# Patient Record
Sex: Male | Born: 1970
Health system: Southern US, Community
[De-identification: ages and names within clinical notes are randomized; demographics above are authoritative.]

## PROBLEM LIST (undated history)

## (undated) DIAGNOSIS — F32A Depression, unspecified: Secondary | ICD-10-CM

## (undated) DIAGNOSIS — F419 Anxiety disorder, unspecified: Secondary | ICD-10-CM

## (undated) DIAGNOSIS — E785 Hyperlipidemia, unspecified: Secondary | ICD-10-CM

## (undated) DIAGNOSIS — I10 Essential (primary) hypertension: Secondary | ICD-10-CM

## (undated) DIAGNOSIS — F329 Major depressive disorder, single episode, unspecified: Secondary | ICD-10-CM

## (undated) HISTORY — DX: Essential (primary) hypertension: I10

## (undated) HISTORY — DX: Hyperlipidemia, unspecified: E78.5

## (undated) HISTORY — DX: Major depressive disorder, single episode, unspecified: F32.9

## (undated) HISTORY — PX: HERNIA REPAIR: SHX51

## (undated) HISTORY — DX: Anxiety disorder, unspecified: F41.9

## (undated) HISTORY — DX: Depression, unspecified: F32.A

## (undated) HISTORY — PX: OTHER SURGICAL HISTORY: SHX169

---

## 2010-05-10 ENCOUNTER — Emergency Department (HOSPITAL_COMMUNITY): Admission: EM | Admit: 2010-05-10 | Discharge: 2010-05-10 | Payer: Self-pay | Admitting: Family Medicine

## 2010-05-19 ENCOUNTER — Emergency Department (HOSPITAL_COMMUNITY)
Admission: EM | Admit: 2010-05-19 | Discharge: 2010-05-19 | Payer: Self-pay | Source: Home / Self Care | Admitting: Emergency Medicine

## 2010-12-07 ENCOUNTER — Inpatient Hospital Stay (INDEPENDENT_AMBULATORY_CARE_PROVIDER_SITE_OTHER)
Admission: RE | Admit: 2010-12-07 | Discharge: 2010-12-07 | Disposition: A | Discharge status: Home / Self Care | Payer: 59 | Source: Ambulatory Visit | Attending: Family Medicine | Admitting: Family Medicine

## 2010-12-07 DIAGNOSIS — J029 Acute pharyngitis, unspecified: Secondary | ICD-10-CM

## 2011-04-16 ENCOUNTER — Inpatient Hospital Stay (INDEPENDENT_AMBULATORY_CARE_PROVIDER_SITE_OTHER)
Admission: RE | Admit: 2011-04-16 | Discharge: 2011-04-16 | Disposition: A | Discharge status: Home / Self Care | Payer: 59 | Source: Ambulatory Visit | Attending: Family Medicine | Admitting: Family Medicine

## 2011-04-16 DIAGNOSIS — Z0489 Encounter for examination and observation for other specified reasons: Secondary | ICD-10-CM

## 2011-05-10 ENCOUNTER — Emergency Department (HOSPITAL_COMMUNITY)
Admission: EM | Admit: 2011-05-10 | Discharge: 2011-05-10 | Disposition: A | Discharge status: Home / Self Care | Payer: 59 | Attending: Emergency Medicine | Admitting: Emergency Medicine

## 2011-05-10 DIAGNOSIS — R61 Generalized hyperhidrosis: Secondary | ICD-10-CM | POA: Insufficient documentation

## 2011-05-10 DIAGNOSIS — R110 Nausea: Secondary | ICD-10-CM | POA: Insufficient documentation

## 2011-05-10 DIAGNOSIS — Z79899 Other long term (current) drug therapy: Secondary | ICD-10-CM | POA: Insufficient documentation

## 2011-05-10 DIAGNOSIS — I10 Essential (primary) hypertension: Secondary | ICD-10-CM | POA: Insufficient documentation

## 2011-05-10 DIAGNOSIS — F341 Dysthymic disorder: Secondary | ICD-10-CM | POA: Insufficient documentation

## 2011-05-10 DIAGNOSIS — E7800 Pure hypercholesterolemia, unspecified: Secondary | ICD-10-CM | POA: Insufficient documentation

## 2011-05-10 LAB — POCT I-STAT, CHEM 8
BUN: 24 mg/dL — ABNORMAL HIGH (ref 6–23)
Chloride: 104 mEq/L (ref 96–112)
Creatinine, Ser: 1.1 mg/dL (ref 0.50–1.35)
Glucose, Bld: 109 mg/dL — ABNORMAL HIGH (ref 70–99)
Potassium: 3.5 mEq/L (ref 3.5–5.1)

## 2011-09-21 ENCOUNTER — Ambulatory Visit (INDEPENDENT_AMBULATORY_CARE_PROVIDER_SITE_OTHER): Payer: 59

## 2011-09-21 DIAGNOSIS — R609 Edema, unspecified: Secondary | ICD-10-CM

## 2011-09-21 DIAGNOSIS — J9801 Acute bronchospasm: Secondary | ICD-10-CM

## 2011-09-21 DIAGNOSIS — I10 Essential (primary) hypertension: Secondary | ICD-10-CM

## 2011-10-11 ENCOUNTER — Ambulatory Visit (INDEPENDENT_AMBULATORY_CARE_PROVIDER_SITE_OTHER): Payer: 59 | Admitting: Family Medicine

## 2011-10-11 VITALS — BP 119/75 | HR 73 | Temp 98.1°F | Resp 16 | Ht 68.0 in | Wt 195.0 lb

## 2011-10-11 DIAGNOSIS — Z716 Tobacco abuse counseling: Secondary | ICD-10-CM

## 2011-10-11 DIAGNOSIS — Z7189 Other specified counseling: Secondary | ICD-10-CM

## 2011-10-11 DIAGNOSIS — R110 Nausea: Secondary | ICD-10-CM

## 2011-10-11 MED ORDER — PROMETHAZINE HCL 25 MG PO TABS: 25.0000 mg | ORAL_TABLET | Freq: Three times a day (TID) | ORAL | Status: DC | PRN

## 2011-10-11 NOTE — Progress Notes (Signed)
  [facility]:  Office Visit  Chief Complaint:  Chief Complaint  Patient presents with  . Medication Refill    for stopping smoking- also has an issue with FMLA paperwork dates being incorrect     HPI: Seth Waters is a 41 y.o. male who complains of desire to stop smoking, starting Zyban next week on 10/16/11. He gets nauseated when he tries to quit.  No other sxs.  Past Medical History  Diagnosis Date  . Hyperlipidemia   . Hypertension    History reviewed. No pertinent past surgical history. History   Social History  . Marital Status: Single    Spouse Name: N/A    Number of Children: N/A  . Years of Education: N/A   Social History Main Topics  . Smoking status: Current Everyday Smoker -- 15 years    Types: Cigarettes  . Smokeless tobacco: Former Neurosurgeon    Quit date: 10/11/2011  . Alcohol Use: No  . Drug Use: No  . Sexually Active: None   Other Topics Concern  . None   Social History Narrative  . None   History reviewed. No pertinent family history. Allergies  Allergen Reactions  . Simvastatin Swelling   Prior to Admission medications   Medication Sig Start Date End Date Taking? Authorizing Provider  olmesartan (BENICAR) 20 MG tablet Take 20 mg by mouth daily.   Yes Historical Provider, MD  PARoxetine (PAXIL-CR) 25 MG 24 hr tablet Take 25 mg by mouth every morning.   Yes Historical Provider, MD     ROS: The patient denies fevers, chills, night sweats, unintentional weight loss, chest pain, palpitations, wheezing, dyspnea on exertion, nausea, vomiting, abdominal pain, dysuria, hematuria, melena, numbness, weakness, or tingling.  All other systems have been reviewed and were otherwise negative with the exception of those mentioned in the HPI and as above.    PHYSICAL EXAM: Filed Vitals:   10/11/11 1339  BP: 119/75  Pulse: 73  Temp: 98.1 F (36.7 C)  Resp: 16   Filed Vitals:   10/11/11 1339  Height: 5\' 8"  (1.727 m)  Weight: 195 lb  (88.451 kg)   Body mass index is 29.65 kg/(m^2).  General: Alert, no acute distress HEENT:  Normocephalic, atraumatic, oropharynx patent. Cardiovascular:  Regular rate and rhythm, no rubs murmurs or gallops.  No Carotid bruits, radial pulse intact. No pedal edema.  Respiratory: Clear to auscultation bilaterally.  No wheezes, rales, or rhonchi.  No cyanosis, no use of accessory musculature GI: No organomegaly, abdomen is soft and non-tender, positive bowel sounds.  No masses. Skin: No rashes. Neurologic: Facial musculature symmetric. Psychiatric: Patient is appropriate throughout our interaction. Lymphatic: No cervical lymphadenopathy Musculoskeletal: Gait intact.  ASSESSMENT/PLAN: Encounter Diagnoses  Name Primary?  . Encounter for smoking cessation counseling Yes  . Nausea    Patient was given rx for Phenergen. This is multiple attempt at quiting > 10 times. He is starting next week on 10/16/11.     Aydenn Gervin PHUONG, DO 10/11/2011 2:19 PM

## 2011-10-14 DIAGNOSIS — Z0271 Encounter for disability determination: Secondary | ICD-10-CM

## 2011-11-24 ENCOUNTER — Ambulatory Visit (INDEPENDENT_AMBULATORY_CARE_PROVIDER_SITE_OTHER): Payer: 59 | Admitting: Family Medicine

## 2011-11-24 VITALS — BP 154/110 | HR 72 | Temp 98.5°F | Resp 16 | Ht 68.5 in | Wt 198.4 lb

## 2011-11-24 DIAGNOSIS — F419 Anxiety disorder, unspecified: Secondary | ICD-10-CM

## 2011-11-24 DIAGNOSIS — E782 Mixed hyperlipidemia: Secondary | ICD-10-CM

## 2011-11-24 DIAGNOSIS — I10 Essential (primary) hypertension: Secondary | ICD-10-CM | POA: Insufficient documentation

## 2011-11-24 DIAGNOSIS — E785 Hyperlipidemia, unspecified: Secondary | ICD-10-CM | POA: Insufficient documentation

## 2011-11-24 DIAGNOSIS — F32A Depression, unspecified: Secondary | ICD-10-CM | POA: Insufficient documentation

## 2011-11-24 DIAGNOSIS — F411 Generalized anxiety disorder: Secondary | ICD-10-CM

## 2011-11-24 DIAGNOSIS — F329 Major depressive disorder, single episode, unspecified: Secondary | ICD-10-CM | POA: Insufficient documentation

## 2011-11-24 MED ORDER — OLMESARTAN MEDOXOMIL 20 MG PO TABS: 20.0000 mg | ORAL_TABLET | Freq: Every day | ORAL | Status: DC

## 2011-11-24 MED ORDER — ROSUVASTATIN CALCIUM 10 MG PO TABS: 10.0000 mg | ORAL_TABLET | Freq: Every day | ORAL | Status: DC

## 2011-11-24 MED ORDER — PAROXETINE HCL 20 MG PO TABS: 20.0000 mg | ORAL_TABLET | ORAL | Status: DC

## 2011-11-24 MED ORDER — CLONAZEPAM 0.5 MG PO TABS
0.5000 mg | ORAL_TABLET | Freq: Two times a day (BID) | ORAL | Status: DC | PRN
Start: 2011-11-24 — End: 2013-06-24

## 2011-11-24 NOTE — Progress Notes (Signed)
41 yo with extreme stress. Unmarried.  The last 6 months have been very stressful, with work being unpredictable.  The sustained release Paxil is not as effective as the rapid release.  Also, needs refill on Benicar.  Ran out of Benicar.    Has stopped the Crestor but would like to restart and see if myalgias that he has had come back.  O:  Alert, cooperative HEENT:  Unremarkable Chest:  Clear (congested cough) Heart:  Reg, no murmur Abdomen:  Soft, no HSM  A:  1. Hypertension, uncontrolled; 2. Stress, uncontrolled,;  3. Hyperlipidemia, status presumed uncontrolled  P:  Refill benicar Change paxil to the more rapid release Restart the crestor Follow-up 6 weeks.

## 2011-11-24 NOTE — Patient Instructions (Signed)

## 2012-02-17 ENCOUNTER — Emergency Department
Admission: EM | Admit: 2012-02-17 | Discharge: 2012-02-17 | Disposition: A | Discharge status: Home / Self Care | Payer: 59 | Source: Home / Self Care | Attending: Emergency Medicine | Admitting: Emergency Medicine

## 2012-02-17 ENCOUNTER — Encounter: Payer: Self-pay | Admitting: Emergency Medicine

## 2012-02-17 DIAGNOSIS — J069 Acute upper respiratory infection, unspecified: Secondary | ICD-10-CM

## 2012-02-17 MED ORDER — PREDNISONE (PAK) 10 MG PO TABS: 10.0000 mg | ORAL_TABLET | Freq: Every day | ORAL | Status: AC

## 2012-02-17 MED ORDER — AZITHROMYCIN 250 MG PO TABS: ORAL_TABLET | ORAL | Status: AC

## 2012-02-17 NOTE — ED Notes (Signed)
States exposed to URIs at work; x 8 days intermittent congestion and low grade fever; some coughing and generalized body aches.

## 2012-02-17 NOTE — ED Provider Notes (Signed)
History     CSN: [id number]  Arrival date & time 02/17/12  1658   First MD Initiated Contact with Patient 02/17/12 1701      Chief Complaint  Patient presents with  . Nasal Congestion  . Generalized Body Aches    (Consider location/radiation/quality/duration/timing/severity/associated sxs/prior treatment) HPI Seth Waters is a 41 y.o. male who complains of onset of cold symptoms for 8 days.  The symptoms are constant and mild in severity.  He has tried some OTC cough medicine which is helping. No sore throat + cough No pleuritic pain +/- wheezing + nasal congestion + post-nasal drainage No sinus pain/pressure + chest congestion No itchy/red eyes No earache No hemoptysis No SOB No chills/sweats No fever No nausea No vomiting No abdominal pain No diarrhea No skin rashes + fatigue No myalgias No headache    Past Medical History  Diagnosis Date  . Hyperlipidemia   . Hypertension   . Hyperlipidemia     Past Surgical History  Procedure Date  . Hernia repair     Family History  Problem Relation Age of Onset  . Hyperlipidemia Mother   . Hypertension Mother     History  Substance Use Topics  . Smoking status: Current Everyday Smoker -- 0.2 packs/day for 15 years    Types: Cigarettes  . Smokeless tobacco: Former Neurosurgeon    Quit date: 10/11/2011  . Alcohol Use: No      Review of Systems  All other systems reviewed and are negative.    Allergies  Simvastatin  Home Medications   Current Outpatient Rx  Name Route Sig Dispense Refill  . AZITHROMYCIN 250 MG PO TABS  Use as directed 1 each 0  . CLONAZEPAM 0.5 MG PO TABS Oral Take 1 tablet (0.5 mg total) by mouth 2 (two) times daily as needed for anxiety. 60 tablet 5  . OLMESARTAN MEDOXOMIL 20 MG PO TABS Oral Take 1 tablet (20 mg total) by mouth daily. 90 tablet 3  . PAROXETINE HCL 20 MG PO TABS Oral Take 1 tablet (20 mg total) by mouth every morning. 90 tablet 3  . PREDNISONE (PAK) 10 MG PO TABS Oral Take  1 tablet (10 mg total) by mouth daily. 6 day pack, use as directed, Disp 1 pack 21 tablet 0  . PROMETHAZINE HCL 25 MG PO TABS Oral Take 1 tablet (25 mg total) by mouth every 8 (eight) hours as needed for nausea. 30 tablet 0  . ROSUVASTATIN CALCIUM 10 MG PO TABS Oral Take 1 tablet (10 mg total) by mouth daily. 90 tablet 3    BP 116/75  Pulse 69  Temp 98 F (36.7 C) (Oral)  Resp 16  Ht 5\' 9"  (1.753 m)  Wt 190 lb (86.183 kg)  BMI 28.06 kg/m2  SpO2 97%  Physical Exam  Nursing note and vitals reviewed. Constitutional: He is oriented to person, place, and time. He appears well-developed and well-nourished.  HENT:  Head: Normocephalic and atraumatic.  Right Ear: Tympanic membrane, external ear and ear canal normal.  Left Ear: Tympanic membrane, external ear and ear canal normal.  Nose: Mucosal edema and rhinorrhea present.  Mouth/Throat: No oropharyngeal exudate, posterior oropharyngeal edema or posterior oropharyngeal erythema.  Eyes: No scleral icterus.  Neck: Neck supple.  Cardiovascular: Regular rhythm and normal heart sounds.   Pulmonary/Chest: Effort normal and breath sounds normal. No respiratory distress. He has no decreased breath sounds. He has no wheezes. He has no rhonchi.  Neurological: He is alert and oriented to  person, place, and time.  Skin: Skin is warm and dry.  Psychiatric: He has a normal mood and affect. His speech is normal.    ED Course  Procedures (including critical care time)  Labs Reviewed - No data to display No results found.   1. Acute upper respiratory infections of unspecified site       MDM  1)  Take the prescribed antibiotic as instructed. 2)  Use nasal saline solution (over the counter) at least 3 times a day. 3)  Use over the counter decongestants like Zyrtec-D every 12 hours as needed to help with congestion.  If you have hypertension, do not take medicines with sudafed.  4)  Can take tylenol every 6 hours or motrin every 8 hours for  pain or fever. 5)  Follow up with your primary doctor if no improvement in 5-7 days, sooner if increasing pain, fever, or new symptoms.       Marlaine Hind, MD 02/17/12 1728

## 2012-04-24 ENCOUNTER — Ambulatory Visit: Payer: 59 | Attending: Orthopedic Surgery

## 2012-04-24 DIAGNOSIS — M542 Cervicalgia: Secondary | ICD-10-CM | POA: Insufficient documentation

## 2012-04-24 DIAGNOSIS — IMO0001 Reserved for inherently not codable concepts without codable children: Secondary | ICD-10-CM | POA: Insufficient documentation

## 2012-04-24 DIAGNOSIS — M6281 Muscle weakness (generalized): Secondary | ICD-10-CM | POA: Insufficient documentation

## 2012-04-24 DIAGNOSIS — M25519 Pain in unspecified shoulder: Secondary | ICD-10-CM | POA: Insufficient documentation

## 2012-04-24 DIAGNOSIS — M25619 Stiffness of unspecified shoulder, not elsewhere classified: Secondary | ICD-10-CM | POA: Insufficient documentation

## 2012-05-06 ENCOUNTER — Ambulatory Visit: Payer: 59 | Attending: Orthopedic Surgery

## 2012-05-06 DIAGNOSIS — M542 Cervicalgia: Secondary | ICD-10-CM | POA: Insufficient documentation

## 2012-05-06 DIAGNOSIS — M25519 Pain in unspecified shoulder: Secondary | ICD-10-CM | POA: Insufficient documentation

## 2012-05-06 DIAGNOSIS — IMO0001 Reserved for inherently not codable concepts without codable children: Secondary | ICD-10-CM | POA: Insufficient documentation

## 2012-05-06 DIAGNOSIS — M25619 Stiffness of unspecified shoulder, not elsewhere classified: Secondary | ICD-10-CM | POA: Insufficient documentation

## 2012-05-06 DIAGNOSIS — M6281 Muscle weakness (generalized): Secondary | ICD-10-CM | POA: Insufficient documentation

## 2012-05-22 ENCOUNTER — Other Ambulatory Visit: Payer: Self-pay | Admitting: Family Medicine

## 2012-05-22 ENCOUNTER — Ambulatory Visit: Payer: 59

## 2012-08-11 ENCOUNTER — Ambulatory Visit (INDEPENDENT_AMBULATORY_CARE_PROVIDER_SITE_OTHER): Payer: 59 | Admitting: Sports Medicine

## 2012-08-11 ENCOUNTER — Encounter: Payer: Self-pay | Admitting: Sports Medicine

## 2012-08-11 VITALS — BP 114/78 | HR 64 | Wt 194.0 lb

## 2012-08-11 DIAGNOSIS — E785 Hyperlipidemia, unspecified: Secondary | ICD-10-CM

## 2012-08-11 DIAGNOSIS — Z299 Encounter for prophylactic measures, unspecified: Secondary | ICD-10-CM

## 2012-08-11 DIAGNOSIS — Z0000 Encounter for general adult medical examination without abnormal findings: Secondary | ICD-10-CM | POA: Insufficient documentation

## 2012-08-11 DIAGNOSIS — M5412 Radiculopathy, cervical region: Secondary | ICD-10-CM

## 2012-08-11 DIAGNOSIS — F17200 Nicotine dependence, unspecified, uncomplicated: Secondary | ICD-10-CM | POA: Insufficient documentation

## 2012-08-11 MED ORDER — VARENICLINE TARTRATE 0.5 MG X 11 & 1 MG X 42 PO MISC: ORAL | Status: DC

## 2012-08-11 MED ORDER — ROSUVASTATIN CALCIUM 20 MG PO TABS: 20.0000 mg | ORAL_TABLET | Freq: Every day | ORAL | Status: DC

## 2012-08-11 NOTE — Assessment & Plan Note (Signed)
Up-to-date on flu and tetanus. Adding testosterone level in addition to the below labs

## 2012-08-11 NOTE — Progress Notes (Signed)
Subjective:    CC: Establish care.   HPI:  Neck and shoulder pain: This is present for many years, he went to the orthopedist who placed him into physical therapy for his shoulder, and a subacromial injection. Unfortunately this did not help his symptoms. Describes pain in the right side of his neck and trapezius, radiating down below shoulder any numb and tingly-type sensation. This is not made worse by any position, or Valsalva. He did have some traction done that was mildly effective.  Smoker: Desires to quit, has cut back significantly with Wellbutrin, but is interested in Chantix.  Hyperlipidemia: On Crestor 20 mg daily, has not had cholesterol checked in a long time. This is overall stable per patient's report.  Past medical history, Surgical history, Family history, Social history, Allergies, and medications have been entered into the medical record, reviewed, and no changes needed.   Review of Systems: No headache, visual changes, nausea, vomiting, diarrhea, constipation, dizziness, abdominal pain, skin rash, fevers, chills, night sweats, swollen lymph nodes, weight loss, chest pain, body aches, joint swelling, muscle aches, shortness of breath, mood changes, visual or auditory hallucinations.  Objective:    General: Well Developed, well nourished, and in no acute distress.  Neuro: Alert and oriented x3, extra-ocular muscles intact.  HEENT: Normocephalic, atraumatic, pupils equal round reactive to light, neck supple, no masses, no lymphadenopathy, thyroid nonpalpable.  Skin: Warm and dry, no rashes noted.  Cardiac: Regular rate and rhythm, no murmurs rubs or gallops.  Respiratory: Clear to auscultation bilaterally. Not using accessory muscles, speaking in full sentences.  Abdominal: Soft, nontender, nondistended, positive bowel sounds, no masses, no organomegaly.  Musculoskeletal: Shoulder, elbow, wrist, hip, knee, ankle stable, and with full range of motion.  Impression and  Recommendations:    The patient was counselled, risk factors were discussed, anticipatory guidance given.

## 2012-08-11 NOTE — Assessment & Plan Note (Signed)
Right-sided likely C5 nerve root. He has failed conservative therapy, we will go ahead and obtain an MRI.  This will be for interventional injection plan

## 2012-08-11 NOTE — Assessment & Plan Note (Signed)
Insufficient control on Wellbutrin. We're going to add Chantix.

## 2012-08-11 NOTE — Assessment & Plan Note (Signed)
Refilling Crestor, checking lipid panel and CMET.

## 2012-08-15 ENCOUNTER — Ambulatory Visit (HOSPITAL_BASED_OUTPATIENT_CLINIC_OR_DEPARTMENT_OTHER)
Admission: RE | Admit: 2012-08-15 | Discharge: 2012-08-15 | Disposition: A | Discharge status: Home / Self Care | Payer: 59 | Source: Ambulatory Visit | Attending: Sports Medicine | Admitting: Sports Medicine

## 2012-08-15 DIAGNOSIS — M47812 Spondylosis without myelopathy or radiculopathy, cervical region: Secondary | ICD-10-CM | POA: Insufficient documentation

## 2012-08-15 DIAGNOSIS — M5412 Radiculopathy, cervical region: Secondary | ICD-10-CM | POA: Insufficient documentation

## 2012-09-01 LAB — CBC
HCT: 43.4 % (ref 39.0–52.0)
Hemoglobin: 15.5 g/dL (ref 13.0–17.0)
MCH: 31.4 pg (ref 26.0–34.0)
MCHC: 35.7 g/dL (ref 30.0–36.0)
MCV: 87.9 fL (ref 78.0–100.0)
Platelets: 248 K/uL (ref 150–400)
RBC: 4.94 MIL/uL (ref 4.22–5.81)
RDW: 13.6 % (ref 11.5–15.5)
WBC: 14.6 K/uL — ABNORMAL HIGH (ref 4.0–10.5)

## 2012-09-01 LAB — COMPREHENSIVE METABOLIC PANEL WITH GFR
ALT: 40 U/L (ref 0–53)
AST: 30 U/L (ref 0–37)
CO2: 26 meq/L (ref 19–32)
Calcium: 9.4 mg/dL (ref 8.4–10.5)
Chloride: 103 meq/L (ref 96–112)
Creat: 0.97 mg/dL (ref 0.50–1.35)
Potassium: 4.8 meq/L (ref 3.5–5.3)
Sodium: 138 meq/L (ref 135–145)
Total Protein: 5.9 g/dL — ABNORMAL LOW (ref 6.0–8.3)

## 2012-09-01 LAB — LIPID PANEL
Cholesterol: 200 mg/dL (ref 0–200)
HDL: 42 mg/dL (ref >39)
LDL Cholesterol: 135 mg/dL — ABNORMAL HIGH (ref 0–99)
Total CHOL/HDL Ratio: 4.8 ratio
Triglycerides: 114 mg/dL (ref <150)
VLDL: 23 mg/dL (ref 0–40)

## 2012-09-01 LAB — COMPREHENSIVE METABOLIC PANEL
Albumin: 3.9 g/dL (ref 3.5–5.2)
Alkaline Phosphatase: 57 U/L (ref 39–117)
BUN: 20 mg/dL (ref 6–23)
Glucose, Bld: 89 mg/dL (ref 70–99)
Total Bilirubin: 0.7 mg/dL (ref 0.3–1.2)

## 2012-09-02 LAB — TESTOSTERONE, FREE, TOTAL, SHBG
Sex Hormone Binding: 70 nmol/L (ref 13–71)
Testosterone, Free: 79.1 pg/mL (ref 47.0–244.0)
Testosterone-% Free: 1.3 % — ABNORMAL LOW (ref 1.6–2.9)
Testosterone: 620.22 ng/dL (ref 300–890)

## 2012-09-02 MED ORDER — ROSUVASTATIN CALCIUM 40 MG PO TABS: 40.0000 mg | ORAL_TABLET | Freq: Every day | ORAL | Status: DC

## 2012-09-02 NOTE — Addendum Note (Signed)
Addended by: Monica Becton on: 09/02/2012 11:07 AM   Modules accepted: Orders

## 2012-10-10 ENCOUNTER — Other Ambulatory Visit: Payer: Self-pay

## 2012-11-09 ENCOUNTER — Telehealth: Payer: Self-pay | Admitting: *Deleted

## 2012-11-09 MED ORDER — BUPROPION HCL ER (SR) 150 MG PO TB12: 150.0000 mg | ORAL_TABLET | Freq: Two times a day (BID) | ORAL | Status: DC

## 2012-11-09 NOTE — Telephone Encounter (Signed)
Thanks for sending to me, Refilled.

## 2012-11-09 NOTE — Telephone Encounter (Signed)
Pt has called requesting refill on Wellbutrin but we have never filled it for the pt. Please advise if I can fill.

## 2013-06-07 ENCOUNTER — Encounter: Payer: Self-pay | Admitting: Podiatry

## 2013-06-07 NOTE — Progress Notes (Signed)
Patient ID: Seth Waters, male   DOB: 13-Dec-1970, 42 y.o.   MRN: [id number]  PATIENT PICKED UP ORTHOTICS

## 2013-06-24 ENCOUNTER — Ambulatory Visit (INDEPENDENT_AMBULATORY_CARE_PROVIDER_SITE_OTHER): Payer: 59 | Admitting: Sports Medicine

## 2013-06-24 ENCOUNTER — Encounter: Payer: Self-pay | Admitting: Sports Medicine

## 2013-06-24 VITALS — BP 117/79 | HR 55 | Wt 194.0 lb

## 2013-06-24 DIAGNOSIS — M5412 Radiculopathy, cervical region: Secondary | ICD-10-CM

## 2013-06-24 DIAGNOSIS — F329 Major depressive disorder, single episode, unspecified: Secondary | ICD-10-CM

## 2013-06-24 DIAGNOSIS — F341 Dysthymic disorder: Secondary | ICD-10-CM

## 2013-06-24 DIAGNOSIS — F32A Depression, unspecified: Secondary | ICD-10-CM

## 2013-06-24 MED ORDER — VENLAFAXINE HCL ER 75 MG PO CP24: 75.0000 mg | ORAL_CAPSULE | Freq: Every day | ORAL | Status: DC

## 2013-06-24 NOTE — Progress Notes (Signed)
  Subjective:    CC: Follow up  HPI: Cervical radiculitis we got an MRI a long time ago, he never followed up, he has been coming down the right arm in the upper cervical distribution. Overall it's really not feeling that bad, but he's never really had any treatment for this. No abnormalities of gait, no bowel or bladder dysfunction, no saddle numbness. No constitutional symptoms.  Depression: Had a bad response to Wellbutrin, and had sexual dysfunction on Paxil. Wondering what else can be tried. He does endorse anxiety, depressed mood, poor sleep, poor concentration, poor interest. No suicidal or homicidal ideation.  Past medical history, Surgical history, Family history not pertinant except as noted below, Social history, Allergies, and medications have been entered into the medical record, reviewed, and no changes needed.   Review of Systems: No fevers, chills, night sweats, weight loss, chest pain, or shortness of breath.   Objective:    General: Well Developed, well nourished, and in no acute distress.  Neuro: Alert and oriented x3, extra-ocular muscles intact, sensation grossly intact.  HEENT: Normocephalic, atraumatic, pupils equal round reactive to light, neck supple, no masses, no lymphadenopathy, thyroid nonpalpable.  Skin: Warm and dry, no rashes. Cardiac: Regular rate and rhythm, no murmurs rubs or gallops, no lower extremity edema.  Respiratory: Clear to auscultation bilaterally. Not using accessory muscles, speaking in full sentences. Neck: Inspection unremarkable. No palpable stepoffs. Negative Spurling's maneuver. Full neck range of motion Grip strength and sensation normal in bilateral hands Strength good C4 to T1 distribution No sensory change to C4 to T1 Negative Hoffman sign bilaterally Reflexes normal Right Shoulder: Inspection reveals no abnormalities, atrophy or asymmetry. Palpation is normal with no tenderness over AC joint or bicipital groove. ROM is full in  all planes. Rotator cuff strength normal throughout. No signs of impingement with negative Neer and Hawkin's tests, empty can sign. Speeds and Yergason's tests normal. No labral pathology noted with negative Obrien's, negative clunk and good stability. Normal scapular function observed. No painful arc and no drop arm sign. No apprehension sign  Impression and Recommendations:

## 2013-06-24 NOTE — Assessment & Plan Note (Signed)
MRI did show multilevel disc protrusions. We will start conservatively with home exercises, if no better we can certainly proceed down pathway of more aggressive conservative measures. Return in one month for this.

## 2013-06-24 NOTE — Assessment & Plan Note (Signed)
Poor response to SSRIs with sexual side effects. He also did not tolerate Wellbutrin. We are going to start Effexor, I'd like to see him back in 2 weeks. He does agree to combine pharmacotherapy with psychotherapy, I will a referral to behavioral health downstairs.

## 2013-07-01 ENCOUNTER — Other Ambulatory Visit: Payer: Self-pay

## 2013-07-08 ENCOUNTER — Ambulatory Visit (INDEPENDENT_AMBULATORY_CARE_PROVIDER_SITE_OTHER): Payer: 59 | Admitting: Sports Medicine

## 2013-07-08 ENCOUNTER — Encounter: Payer: Self-pay | Admitting: Sports Medicine

## 2013-07-08 VITALS — BP 119/75 | HR 58 | Wt 190.0 lb

## 2013-07-08 DIAGNOSIS — F17200 Nicotine dependence, unspecified, uncomplicated: Secondary | ICD-10-CM

## 2013-07-08 DIAGNOSIS — M722 Plantar fascial fibromatosis: Secondary | ICD-10-CM

## 2013-07-08 DIAGNOSIS — F341 Dysthymic disorder: Secondary | ICD-10-CM

## 2013-07-08 DIAGNOSIS — F32A Depression, unspecified: Secondary | ICD-10-CM

## 2013-07-08 DIAGNOSIS — F329 Major depressive disorder, single episode, unspecified: Secondary | ICD-10-CM

## 2013-07-08 DIAGNOSIS — Z299 Encounter for prophylactic measures, unspecified: Secondary | ICD-10-CM

## 2013-07-08 DIAGNOSIS — M5412 Radiculopathy, cervical region: Secondary | ICD-10-CM

## 2013-07-08 MED ORDER — BUPROPION HCL ER (XL) 300 MG PO TB24: 300.0000 mg | ORAL_TABLET | Freq: Every day | ORAL | Status: DC

## 2013-07-08 NOTE — Progress Notes (Signed)
  Subjective:    CC: Follow up  HPI: Anxiety and depression: initially did not have a good response to Wellbutrin but admits to not taking this as prescribed, did have difficulty achieving orgasm with Paxil, and did not like how venlafaxine made him feel. Since then he has gone back on the Wellbutrin that he had laying around. Feels much better, and is currently taking it 150 mg twice a day.   Smoker: Has decreased his smoking since starting Wellbutrin.  Hyperlipidemia: Stable on dietary modification.  Past medical history, Surgical history, Family history not pertinant except as noted below, Social history, Allergies, and medications have been entered into the medical record, reviewed, and no changes needed.   Review of Systems: No fevers, chills, night sweats, weight loss, chest pain, or shortness of breath.   Objective:    General: Well Developed, well nourished, and in no acute distress.  Neuro: Alert and oriented x3, extra-ocular muscles intact, sensation grossly intact.  HEENT: Normocephalic, atraumatic, pupils equal round reactive to light, neck supple, no masses, no lymphadenopathy, thyroid nonpalpable.  Skin: Warm and dry, no rashes. Cardiac: Regular rate and rhythm, no murmurs rubs or gallops, no lower extremity edema.  Respiratory: Clear to auscultation bilaterally. Not using accessory muscles, speaking in full sentences. Impression and Recommendations:

## 2013-07-08 NOTE — Assessment & Plan Note (Signed)
Was unable to tolerate venlafaxine. He went back to his Wellbutrin 150 mg twice a day and has noted a good response. At this point we are going to switch him to 300 mg extended release once a day. Like to see him back in about one to 2 months.

## 2013-07-08 NOTE — Assessment & Plan Note (Signed)
Significantly better with Wellbutrin.

## 2013-07-08 NOTE — Assessment & Plan Note (Signed)
Continue home exercises. We can revisit this at the end of the month.

## 2013-07-08 NOTE — Assessment & Plan Note (Signed)
Return for custom orthotics at my next available slot.

## 2013-07-08 NOTE — Assessment & Plan Note (Signed)
UTD on TDap and Flu.

## 2013-07-16 ENCOUNTER — Ambulatory Visit (INDEPENDENT_AMBULATORY_CARE_PROVIDER_SITE_OTHER): Payer: 59 | Admitting: Sports Medicine

## 2013-07-16 ENCOUNTER — Encounter: Payer: Self-pay | Admitting: Sports Medicine

## 2013-07-16 VITALS — BP 126/81 | HR 64 | Wt 192.0 lb

## 2013-07-16 DIAGNOSIS — M722 Plantar fascial fibromatosis: Secondary | ICD-10-CM

## 2013-07-16 NOTE — Assessment & Plan Note (Signed)
Museum/gallery exhibitions officer as above. Home rehab. Return in 1 month.

## 2013-07-16 NOTE — Progress Notes (Signed)
    Patient was fitted for a : standard, cushioned, semi-rigid orthotic. The orthotic was heated and afterward the patient stood on the orthotic blank positioned on the orthotic stand. The patient was positioned in subtalar neutral position and 10 degrees of ankle dorsiflexion in a weight bearing stance. After completion of molding, a stable base was applied to the orthotic blank. The blank was ground to a stable position for weight bearing. Size: 11 Base: Blue EVA Additional Posting and Padding: None The patient ambulated these, and they were very comfortable.  I spent 40 minutes with this patient, greater than 50% was face-to-face time counseling regarding the below diagnosis.   

## 2013-08-22 ENCOUNTER — Encounter: Payer: Self-pay | Admitting: Emergency Medicine

## 2013-08-22 ENCOUNTER — Emergency Department
Admission: EM | Admit: 2013-08-22 | Discharge: 2013-08-22 | Disposition: A | Discharge status: Home / Self Care | Payer: 59 | Source: Home / Self Care | Attending: Family Medicine | Admitting: Family Medicine

## 2013-08-22 DIAGNOSIS — R369 Urethral discharge, unspecified: Secondary | ICD-10-CM

## 2013-08-22 DIAGNOSIS — H109 Unspecified conjunctivitis: Secondary | ICD-10-CM

## 2013-08-22 DIAGNOSIS — Z202 Contact with and (suspected) exposure to infections with a predominantly sexual mode of transmission: Secondary | ICD-10-CM

## 2013-08-22 LAB — POCT URINALYSIS DIP (MANUAL ENTRY)
Ketones, POC UA: NEGATIVE
Nitrite, UA: NEGATIVE
Protein Ur, POC: NEGATIVE
Urobilinogen, UA: 0.2 (ref 0–1)

## 2013-08-22 LAB — RPR

## 2013-08-22 MED ORDER — CEFTRIAXONE SODIUM 250 MG IJ SOLR
250.0000 mg | Freq: Once | INTRAMUSCULAR | Status: AC
  Administered 2013-08-22: 250 mg via INTRAMUSCULAR

## 2013-08-22 MED ORDER — CIPROFLOXACIN HCL 500 MG PO TABS: 500.0000 mg | ORAL_TABLET | Freq: Two times a day (BID) | ORAL | Status: DC

## 2013-08-22 MED ORDER — AZITHROMYCIN 250 MG PO TABS: ORAL_TABLET | ORAL | Status: DC

## 2013-08-22 MED ORDER — AZITHROMYCIN 1 % OP SOLN: 1.0000 [drp] | Freq: Every day | OPHTHALMIC | Status: DC

## 2013-08-22 NOTE — ED Provider Notes (Addendum)
CSN: [id number]     Arrival date & time 08/22/13  1537 History   First MD Initiated Contact with Patient 08/22/13 1612     Chief Complaint  Patient presents with  . Eye Drainage    HPI  Pt initially reported eye drainage over the last 2 days.  + itching and watery drainage.  No headache, eye pain, LOV.  However, pt also reports that he has penile discharge over the past 1-2 weeks.  Pt is unsure if these 2 issues are unrelated.  + unprotected sexual activity with new sexual partner within the last 2 months.  No known hx/o STDs in the past.  Mild dysuria.  No back pain.  No fevers or chills.   Past Medical History  Diagnosis Date  . Hyperlipidemia   . Hypertension   . Hyperlipidemia    Past Surgical History  Procedure Laterality Date  . Hernia repair     Family History  Problem Relation Age of Onset  . Hyperlipidemia Mother   . Hypertension Mother    History  Substance Use Topics  . Smoking status: Current Every Day Smoker -- 0.25 packs/day for 15 years    Types: Cigarettes  . Smokeless tobacco: Former Neurosurgeon    Quit date: 10/11/2011  . Alcohol Use: No    Review of Systems  All other systems reviewed and are negative.    Allergies  Simvastatin  Home Medications   Current Outpatient Rx  Name  Route  Sig  Dispense  Refill  . azithromycin (AZASITE) 1 % ophthalmic solution   Left Eye   Place 1 drop into the left eye daily.   2.5 mL   0   . azithromycin (ZITHROMAX) 250 MG tablet      4 tabs po x 1   6 tablet   0   . buPROPion (WELLBUTRIN XL) 300 MG 24 hr tablet   Oral   Take 1 tablet (300 mg total) by mouth daily.   90 tablet   3   . rosuvastatin (CRESTOR) 40 MG tablet   Oral   Take 1 tablet (40 mg total) by mouth daily.   90 tablet   3    BP 117/80  Pulse 87  Temp(Src) 98.1 F (36.7 C) (Oral)  Resp 16  Ht 5\' 9"  (1.753 m)  Wt 189 lb (85.73 kg)  BMI 27.90 kg/m2  SpO2 96% Physical Exam  Constitutional: He is oriented to person, place,  and time. He appears well-developed and well-nourished.  HENT:  Head: Normocephalic and atraumatic.  Right Ear: External ear normal.  Eyes: Pupils are equal, round, and reactive to light.  Faint L eye conjunctival erythema  No photophobia L eye full ROM  Minimal clear drainage.    Neck: Normal range of motion. Neck supple.  Cardiovascular: Normal rate and regular rhythm.   Pulmonary/Chest: Effort normal and breath sounds normal.  Abdominal: Soft.  Genitourinary: Prostate normal.    No penile tenderness.  Small <0.5x0.5 cm erythematous lesion on distal penile shaft-pt states this is chronic.    Musculoskeletal: Normal range of motion.  Neurological: He is alert and oriented to person, place, and time.  Skin: Skin is warm.    ED Course  Procedures (including critical care time) Labs Review Labs Reviewed  URINE CULTURE  GC/CHLAMYDIA PROBE AMP, URINE  HIV ANTIBODY (ROUTINE TESTING)  RPR  POCT URINALYSIS DIP (MANUAL ENTRY)   Imaging Review No results found.  EKG Interpretation    Date/Time:  Ventricular Rate:    PR Interval:    QRS Duration:   QT Interval:    QTC Calculation:   R Axis:     Text Interpretation:              MDM   1. Conjunctivitis   2. Penile discharge   3. Possible exposure to STD    Conjunctivitis seems viral vs. allergic in etiology. No photophobia or corneal pathology; though will put on azithromycin op for infectious/STI coverage.  Rocephin 250mg  IM x1 Azithromycin 1gm po x1 Will place on extended course of cipro.  Urine culture Urine GC/Chl, HIV, RPR Discussed infectious, GU, op red flags at length.  Discussed safe sex practices. Handout given.      Doree Albee, MD 08/22/13 1641  Doree Albee, MD 08/22/13 [phone]

## 2013-08-22 NOTE — ED Notes (Signed)
States left eye has had discharge and been itching x 2 days; somewhat blurry vision.

## 2013-08-23 LAB — GC/CHLAMYDIA PROBE AMP, URINE: GC Probe Amp, Urine: NEGATIVE

## 2013-08-23 LAB — URINE CULTURE: Colony Count: NO GROWTH

## 2013-08-30 ENCOUNTER — Telehealth: Payer: Self-pay | Admitting: *Deleted

## 2013-08-31 ENCOUNTER — Ambulatory Visit: Payer: 59 | Admitting: Sports Medicine

## 2013-08-31 DIAGNOSIS — Z0289 Encounter for other administrative examinations: Secondary | ICD-10-CM

## 2013-09-01 ENCOUNTER — Ambulatory Visit: Payer: 59 | Admitting: Family Medicine

## 2013-09-01 ENCOUNTER — Encounter: Payer: Self-pay | Admitting: Family Medicine

## 2013-09-01 ENCOUNTER — Ambulatory Visit (INDEPENDENT_AMBULATORY_CARE_PROVIDER_SITE_OTHER): Payer: 59 | Admitting: Family Medicine

## 2013-09-01 VITALS — BP 119/77 | HR 70 | Temp 98.0°F | Ht 69.0 in | Wt 193.0 lb

## 2013-09-01 DIAGNOSIS — A499 Bacterial infection, unspecified: Secondary | ICD-10-CM

## 2013-09-01 DIAGNOSIS — H1089 Other conjunctivitis: Secondary | ICD-10-CM

## 2013-09-01 DIAGNOSIS — B9689 Other specified bacterial agents as the cause of diseases classified elsewhere: Secondary | ICD-10-CM

## 2013-09-01 DIAGNOSIS — H539 Unspecified visual disturbance: Secondary | ICD-10-CM

## 2013-09-01 DIAGNOSIS — H109 Unspecified conjunctivitis: Secondary | ICD-10-CM

## 2013-09-01 MED ORDER — CIPROFLOXACIN HCL 0.3 % OP SOLN: OPHTHALMIC | Status: DC

## 2013-09-01 MED ORDER — DICLOFENAC SODIUM 0.1 % OP SOLN: 1.0000 [drp] | Freq: Four times a day (QID) | OPHTHALMIC | Status: DC

## 2013-09-01 NOTE — Patient Instructions (Signed)
Stop the polytrim eye drops.  Start the Cipro eye drops. See med list for instructions on use.   Can use

## 2013-09-01 NOTE — Progress Notes (Signed)
   Subjective:    Patient ID: Seth Waters, male    DOB: 1971-01-14, 43 y.o.   MRN: [id number]  HPI Seen on 12/28 and dx with conjunctiviti. He was originally prescribed azithromycin ophthalmic drops. Unfortunately it was not covered on her insurance so he's currently using polymyxin B sulfate. He's using it in both eyes. Started in the left eye and now in both.  Eye have been really watery for the last 2 days.  No trauma or corneal injury.  Wears glasses. Feels like rocks under his eye. Waking up with it crusted over.  No URI sxs.  Vision is blurry. No photophobia.    Review of Systems     Objective:   Physical Exam  Constitutional: He appears well-developed and well-nourished.  HENT:  Head: Normocephalic and atraumatic.  Left scler injected and inflammed.  Upper and lower lids are mildly edematous with mild erythema.  Clear watery discharged. EOMi, PEERLA.   Eyes: Conjunctivae are normal. Pupils are equal, round, and reactive to light.  Skin: Skin is warm and dry.  Psychiatric: He has a normal mood and affect. His behavior is normal.    Tetracaine drops applied to the eye. Fluorescin stain then applied and examined with a black light for any corneal abrasions. Negative for abrasion. 2 drops of ciprofloxacin ophthalmic ointment applied.      Assessment & Plan:  Bacterial conjunctivitis-he's had symptoms for at least 78 days at this point in time. He became much more swollen about 2 days ago. He denies any photophobia which is reassuring. His vision is 20/50 in both eyes without his lenses so it is symmetric. I did examine the cornea for any type of abrasion and it was normal. I did put a drop of tetracaine in for acute relief. We'll change his topical antibiotic drops from Polytrim to ciprofloxacin. Given sample bottle used here. We'll also prescribe diclofenac ophthalmic for pain relief. Call if not noticing a significant improvement within 48 hours. If he notices vision change, pain, or  photophobia, he needs to call our office immediately for referral to ophthalmology.

## 2013-09-03 ENCOUNTER — Encounter: Payer: Self-pay | Admitting: *Deleted

## 2013-09-06 ENCOUNTER — Telehealth: Payer: Self-pay | Admitting: *Deleted

## 2013-09-06 NOTE — Telephone Encounter (Signed)
fmla faxed. Copies made and placed in scan.Seth Waters

## 2013-09-24 ENCOUNTER — Ambulatory Visit (INDEPENDENT_AMBULATORY_CARE_PROVIDER_SITE_OTHER): Payer: 59 | Admitting: Sports Medicine

## 2013-09-24 ENCOUNTER — Encounter: Payer: Self-pay | Admitting: Sports Medicine

## 2013-09-24 VITALS — BP 127/80 | HR 70 | Ht 69.0 in | Wt 191.0 lb

## 2013-09-24 DIAGNOSIS — K645 Perianal venous thrombosis: Secondary | ICD-10-CM

## 2013-09-24 MED ORDER — IBUPROFEN 800 MG PO TABS: 800.0000 mg | ORAL_TABLET | Freq: Three times a day (TID) | ORAL | Status: DC | PRN

## 2013-09-24 MED ORDER — HYDROCODONE-ACETAMINOPHEN 5-325 MG PO TABS: 1.0000 | ORAL_TABLET | Freq: Three times a day (TID) | ORAL | Status: DC | PRN

## 2013-09-24 MED ORDER — DOCUSATE SODIUM 100 MG PO CAPS: 100.0000 mg | ORAL_CAPSULE | Freq: Three times a day (TID) | ORAL | Status: DC | PRN

## 2013-09-24 MED ORDER — WITCH HAZEL-GLYCERIN EX PADS: 1.0000 "application " | MEDICATED_PAD | CUTANEOUS | Status: DC | PRN

## 2013-09-24 MED ORDER — HYDROCORTISONE ACETATE 25 MG RE SUPP: 25.0000 mg | Freq: Two times a day (BID) | RECTAL | Status: DC | PRN

## 2013-09-24 NOTE — Assessment & Plan Note (Signed)
Declines surgical thrombectomy here in the office. Anusol-HC, ibuprofen, hydrocodone, witch hazel pads, Colace. Return as needed.

## 2013-09-24 NOTE — Patient Instructions (Signed)
Hemorrhoids Hemorrhoids are swollen veins around the rectum or anus. There are two types of hemorrhoids:   Internal hemorrhoids. These occur in the veins just inside the rectum. They may poke through to the outside and become irritated and painful.  External hemorrhoids. These occur in the veins outside the anus and can be felt as a painful swelling or hard lump near the anus. CAUSES  Pregnancy.   Obesity.   Constipation or diarrhea.   Straining to have a bowel movement.   Sitting for long periods on the toilet.  Heavy lifting or other activity that caused you to strain.  Anal intercourse. SYMPTOMS   Pain.   Anal itching or irritation.   Rectal bleeding.   Fecal leakage.   Anal swelling.   One or more lumps around the anus.  DIAGNOSIS  Your caregiver may be able to diagnose hemorrhoids by visual examination. Other examinations or tests that may be performed include:   Examination of the rectal area with a gloved hand (digital rectal exam).   Examination of anal canal using a small tube (scope).   A blood test if you have lost a significant amount of blood.  A test to look inside the colon (sigmoidoscopy or colonoscopy). TREATMENT Most hemorrhoids can be treated at home. However, if symptoms do not seem to be getting better or if you have a lot of rectal bleeding, your caregiver may perform a procedure to help make the hemorrhoids get smaller or remove them completely. Possible treatments include:   Placing a rubber band at the base of the hemorrhoid to cut off the circulation (rubber band ligation).   Injecting a chemical to shrink the hemorrhoid (sclerotherapy).   Using a tool to burn the hemorrhoid (infrared light therapy).   Surgically removing the hemorrhoid (hemorrhoidectomy).   Stapling the hemorrhoid to block blood flow to the tissue (hemorrhoid stapling).  HOME CARE INSTRUCTIONS   Eat foods with fiber, such as whole grains, beans,  nuts, fruits, and vegetables. Ask your doctor about taking products with added fiber in them (fibersupplements).  Increase fluid intake. Drink enough water and fluids to keep your urine clear or pale yellow.   Exercise regularly.   Go to the bathroom when you have the urge to have a bowel movement. Do not wait.   Avoid straining to have bowel movements.   Keep the anal area dry and clean. Use wet toilet paper or moist towelettes after a bowel movement.   Medicated creams and suppositories may be used or applied as directed.   Only take over-the-counter or prescription medicines as directed by your caregiver.   Take warm sitz baths for 15 20 minutes, 3 4 times a day to ease pain and discomfort.   Place ice packs on the hemorrhoids if they are tender and swollen. Using ice packs between sitz baths may be helpful.   Put ice in a plastic bag.   Place a towel between your skin and the bag.   Leave the ice on for 15 20 minutes, 3 4 times a day.   Do not use a donut-shaped pillow or sit on the toilet for long periods. This increases blood pooling and pain.  SEEK MEDICAL CARE IF:  You have increasing pain and swelling that is not controlled by treatment or medicine.  You have uncontrolled bleeding.  You have difficulty or you are unable to have a bowel movement.  You have pain or inflammation outside the area of the hemorrhoids. MAKE SURE YOU:    Understand these instructions.  Will watch your condition.  Will get help right away if you are not doing well or get worse. Document Released: 08/09/2000 Document Revised: 07/29/2012 Document Reviewed: 06/16/2012 [employer] Patient Information 2014 [employer].  

## 2013-09-24 NOTE — Progress Notes (Signed)
  Subjective:    CC: Hemorrhoids  HPI: For the past 3 days this pleasant 43 year old male has had pain, exquisite, localized on the anterior aspect of his anus. It is difficult to take bowel movements. Pain is severe, persistent. No radiation. No constitutional symptoms. No rectal bleeding.  Past medical history, Surgical history, Family history not pertinant except as noted below, Social history, Allergies, and medications have been entered into the medical record, reviewed, and no changes needed.   Review of Systems: No fevers, chills, night sweats, weight loss, chest pain, or shortness of breath.   Objective:    General: Well Developed, well nourished, and in no acute distress.  Neuro: Alert and oriented x3, extra-ocular muscles intact, sensation grossly intact.  HEENT: Normocephalic, atraumatic, pupils equal round reactive to light, neck supple, no masses, no lymphadenopathy, thyroid nonpalpable.  Skin: Warm and dry, no rashes. Cardiac: Regular rate and rhythm, no murmurs rubs or gallops, no lower extremity edema.  Respiratory: Clear to auscultation bilaterally. Not using accessory muscles, speaking in full sentences. Anal: There is a thrombosed hemorrhoid that is exquisitely tender to palpation at the 12:00 position.  Impression and Recommendations:

## 2013-10-02 ENCOUNTER — Other Ambulatory Visit: Payer: Self-pay | Admitting: Sports Medicine

## 2013-10-02 DIAGNOSIS — F419 Anxiety disorder, unspecified: Secondary | ICD-10-CM

## 2013-10-02 DIAGNOSIS — F32A Depression, unspecified: Secondary | ICD-10-CM

## 2013-10-02 DIAGNOSIS — F17200 Nicotine dependence, unspecified, uncomplicated: Secondary | ICD-10-CM

## 2013-10-02 DIAGNOSIS — F329 Major depressive disorder, single episode, unspecified: Secondary | ICD-10-CM

## 2013-10-02 MED ORDER — BUPROPION HCL ER (XL) 150 MG PO TB24: 150.0000 mg | ORAL_TABLET | Freq: Every day | ORAL | Status: DC

## 2013-10-02 NOTE — Assessment & Plan Note (Signed)
Per patient request decreasing Wellbutrin to 150 daily.

## 2013-11-01 ENCOUNTER — Ambulatory Visit (INDEPENDENT_AMBULATORY_CARE_PROVIDER_SITE_OTHER): Payer: 59 | Admitting: Sports Medicine

## 2013-11-01 ENCOUNTER — Encounter: Payer: Self-pay | Admitting: Sports Medicine

## 2013-11-01 VITALS — BP 126/80 | HR 60 | Ht 69.0 in | Wt 189.0 lb

## 2013-11-01 DIAGNOSIS — B354 Tinea corporis: Secondary | ICD-10-CM

## 2013-11-01 DIAGNOSIS — M5412 Radiculopathy, cervical region: Secondary | ICD-10-CM

## 2013-11-01 MED ORDER — CLOTRIMAZOLE-BETAMETHASONE 1-0.05 % EX CREA: 1.0000 "application " | TOPICAL_CREAM | Freq: Two times a day (BID) | CUTANEOUS | Status: DC

## 2013-11-01 MED ORDER — AMBULATORY NON FORMULARY MEDICATION: Status: DC

## 2013-11-01 MED ORDER — CYCLOBENZAPRINE HCL 10 MG PO TABS: ORAL_TABLET | ORAL | Status: DC

## 2013-11-01 NOTE — Progress Notes (Signed)
  Subjective:    CC: Recheck neck  HPI: This is a pleasant 43 year old male, he does have existing cervical degenerative disc disease.  We have treated him in the past with conservative measures, unfortunately continued to have pain that radiates over his right upper shoulder. Moderate, persistent, worse with prolonged downgaze.  Skin rash: Localized over the bilateral upper shoulders, scaly, does have a dog.  Past medical history, Surgical history, Family history not pertinant except as noted below, Social history, Allergies, and medications have been entered into the medical record, reviewed, and no changes needed.   Review of Systems: No fevers, chills, night sweats, weight loss, chest pain, or shortness of breath.   Objective:    General: Well Developed, well nourished, and in no acute distress.  Neuro: Alert and oriented x3, extra-ocular muscles intact, sensation grossly intact.  HEENT: Normocephalic, atraumatic, pupils equal round reactive to light, neck supple, no masses, no lymphadenopathy, thyroid nonpalpable.  Skin: Warm and dry, there are 2 approximately 1.5 cm circular lesions that are scaly on both upper shoulders. Cardiac: Regular rate and rhythm, no murmurs rubs or gallops, no lower extremity edema.  Respiratory: Clear to auscultation bilaterally. Not using accessory muscles, speaking in full sentences. Neck: Inspection unremarkable. No palpable stepoffs. Negative Spurling's maneuver. Full neck range of motion Grip strength and sensation normal in bilateral hands Strength good C4 to T1 distribution No sensory change to C4 to T1 Negative Hoffman sign bilaterally Reflexes normal  MRI does show multilevel descriptions worse at the C4-C5 level with mild to moderate right-sided central canal stenosis.  Impression and Recommendations:

## 2013-11-01 NOTE — Assessment & Plan Note (Signed)
Multilevel disc intrusions worse at the C4-C5 level with slightly rightward protrusion. Formal physical therapy, he wants a prescription for hydrotherapy at home, cyclobenzaprine. Return to see me in one month, injection if no better.

## 2013-11-01 NOTE — Assessment & Plan Note (Signed)
Topical Lotrisone. Return as needed for this.

## 2013-11-09 ENCOUNTER — Encounter: Payer: 59 | Admitting: Sports Medicine

## 2014-01-14 ENCOUNTER — Other Ambulatory Visit: Payer: Self-pay | Admitting: Sports Medicine

## 2014-01-14 MED ORDER — ROSUVASTATIN CALCIUM 40 MG PO TABS: 40.0000 mg | ORAL_TABLET | Freq: Every day | ORAL | Status: DC

## 2014-02-22 ENCOUNTER — Encounter: Payer: Self-pay | Admitting: Sports Medicine

## 2014-02-22 ENCOUNTER — Ambulatory Visit (INDEPENDENT_AMBULATORY_CARE_PROVIDER_SITE_OTHER): Payer: 59 | Admitting: Sports Medicine

## 2014-02-22 VITALS — BP 124/69 | HR 70 | Ht 69.0 in | Wt 191.0 lb

## 2014-02-22 DIAGNOSIS — M722 Plantar fascial fibromatosis: Secondary | ICD-10-CM

## 2014-02-22 NOTE — Progress Notes (Signed)
    Patient was fitted for a : standard, cushioned, semi-rigid orthotic. The orthotic was heated and afterward the patient stood on the orthotic blank positioned on the orthotic stand. The patient was positioned in subtalar neutral position and 10 degrees of ankle dorsiflexion in a weight bearing stance. After completion of molding, a stable base was applied to the orthotic blank. The blank was ground to a stable position for weight bearing. Size:10 Base: [facility] and Padding: None The patient ambulated these, and they were very comfortable.  I spent 40 minutes with this patient, greater than 50% was face-to-face time counseling regarding the below diagnosis.

## 2014-02-22 NOTE — Assessment & Plan Note (Signed)
Custom orthotics as above, he had an excellent response to the first pair and desired a second.

## 2014-04-14 ENCOUNTER — Telehealth: Payer: Self-pay | Admitting: Sports Medicine

## 2014-04-14 ENCOUNTER — Ambulatory Visit: Payer: 59 | Admitting: Sports Medicine

## 2014-04-14 MED ORDER — BUPROPION HCL ER (SMOKING DET) 150 MG PO TB12: ORAL_TABLET | ORAL | Status: DC

## 2014-04-14 NOTE — Telephone Encounter (Signed)
Rx called in 

## 2014-04-14 NOTE — Telephone Encounter (Signed)
Spoke With Dr.T and he states that he will call in a Prescription of Zyban and patient request that you call this into [facility]... Thanks, [employer]

## 2014-05-12 ENCOUNTER — Encounter (HOSPITAL_COMMUNITY): Payer: Self-pay | Admitting: Psychiatry

## 2014-05-12 ENCOUNTER — Ambulatory Visit (INDEPENDENT_AMBULATORY_CARE_PROVIDER_SITE_OTHER): Payer: 59 | Admitting: Psychiatry

## 2014-05-12 VITALS — BP 128/80 | HR 68 | Ht 69.0 in | Wt 190.0 lb

## 2014-05-12 DIAGNOSIS — F331 Major depressive disorder, recurrent, moderate: Secondary | ICD-10-CM

## 2014-05-12 DIAGNOSIS — F411 Generalized anxiety disorder: Secondary | ICD-10-CM

## 2014-05-12 MED ORDER — ESCITALOPRAM OXALATE 5 MG PO TABS: 5.0000 mg | ORAL_TABLET | Freq: Every day | ORAL | Status: DC

## 2014-05-12 NOTE — Progress Notes (Signed)
Patient ID: Seth Waters, male   DOB: 1971-06-21, 43 y.o.   MRN: [id number]  [facility] Initial Psychiatric Assessment   Kree Rafter [id number] 43 y.o.  05/12/2014 3:33 PM  Chief Complaint:  Depression and anxiety  History of Present Illness:   Patient Presents for Initial Evaluation with symptoms of feeling down, dysthymic. Has lost interest in usual things he enjoyed in past like hiking, running. Says job has been stressful. He is currently single works in cardiac lab at [employer] have had low grade depression for years but recently has been feeling it more. Endorses disturbed sleep, energy, at times crying spells and loss of interest. No suicidal or homicidal toughts. Also endorses anxiety, worries , sometimes excessive over unreasonable. He feels at night has difficulty sleeping because of her worries muscle tensing and low-grade depression including feeling of onset daily.  At times she has had panic attacks which may or may not be related to some trigger. Apparently he was bullied when he was in high school and left 10 and finished his GED privately. At times he has flashbacks about those days and the triggers that remind him off those and that causes him anxiety. He is developed the anxiety towards talking with strangers or in a large crowds and he feels his heart may start drinking more faster during that time.  He has been sober on marijuana and alcohol for the last 2-1/2 years. In the past he was drinking more so over the weekend and  marijuana pretty much regularly every day. There's been episodes that in that he  has been sober but then most of time since his teenage years he has been using marijuana.  Severity of depression; 5/10. 10 being not depressed S.  Context; stress at work feeling lonely at times.  Modifying factors; hiking running her meeting the family  Associated symptoms; disturbed sleep energy. Feeling of hopelessness and despair  along with anxiety and panic symptoms at times. There is no associated manic like symptoms psychotic symptoms delusions or hallucinations.   Past Psychiatric History/Hospitalization(s) Denies. She has had treatment with Paxil in the past that Paxil caused him diarrhea and also decreased sexual desire for which he did not continue. Never had any inpatient treatment.  Hospitalization for psychiatric illness: No History of Electroconvulsive Shock Therapy: No Prior Suicide Attempts: No  Medical History; Past Medical History  Diagnosis Date  . Hyperlipidemia   . Hypertension   . Hyperlipidemia   . Anxiety   . Depression     Allergies: Allergies  Allergen Reactions  . Simvastatin Swelling    Medications: Outpatient Encounter Prescriptions as of 05/12/2014  Medication Sig  . rosuvastatin (CRESTOR) 40 MG tablet Take 1 tablet (40 mg total) by mouth daily.  . AMBULATORY NON FORMULARY MEDICATION Needs hydrotherapy tub.  Marland Kitchen buPROPion (ZYBAN) 150 MG 12 hr tablet 1 tab PO qd x3d, then BID, d/c smoking after the first 7d.  . clotrimazole-betamethasone (LOTRISONE) cream Apply 1 application topically 2 (two) times daily.  Marland Kitchen escitalopram (LEXAPRO) 5 MG tablet Take 1 tablet (5 mg total) by mouth daily.     Substance Abuse History: Marijuana use since age 68 onwards total 2-1/2 years ago. Has been times that he has been sober but overall regular use of marijuana. Alcohol use also since late teenage years he has been sober since last 2 and half years. He has had 3 DUIs in the past  Family History; Family History  Problem Relation Age of  Onset  . Hyperlipidemia Mother   . Hypertension Mother   . Alcohol abuse Father    Father with depression.   Biopsychosocial History:  Grew up with Parents. Dad was alcoholic and verbally abusive. He was bullied in high school and left in 1oth grad. Finished GEd after. 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 Currently his single does not have any  kids. History of having DUIs in the past.  Labs:  No results found for this or any previous visit (from the past 2160 hour(s)).  Musculoskeletal: Strength & Muscle Tone: within normal limits Gait & Station: normal Patient leans: N/A  Mental Status Examination;   Psychiatric Specialty Exam: Physical Exam  Constitutional: He appears well-developed and well-nourished. No distress.    Review of Systems  Constitutional: Negative.   Neurological: Negative for dizziness, tremors and headaches.  Psychiatric/Behavioral: Positive for depression. Negative for suicidal ideas, hallucinations, memory loss and substance abuse. The patient is nervous/anxious. The patient does not have insomnia.     Blood pressure 128/80, pulse 68, height 5\' 9"  (1.753 m), weight 190 lb (86.183 kg).Body mass index is 28.05 kg/(m^2).  General Appearance: Casual  Eye Contact::  Fair  Speech:  Slow  Volume:  Normal  Mood:  Dysphoric  Affect:  Congruent  Thought Process:  Coherent  Orientation:  Full (Time, Place, and Person)  Thought Content:  Rumination  Suicidal Thoughts:  No  Homicidal Thoughts:  No  Memory:  Immediate;   Fair Recent;   Fair  Judgement:  Intact  Insight:  Shallow  Psychomotor Activity:  Decreased  Concentration:  Fair  Recall:  Fair  Akathisia:  Negative  Handed:  Right  AIMS (if indicated):     Assets:  Desire for Improvement Leisure Time Physical Health Resilience  Sleep:        Assessment: Axis I: Maj. depressive disorder, recurrent mild, moderate. Generalized anxiety disorder  Axis II: Deferred  Axis III:  Past Medical History  Diagnosis Date  . Hyperlipidemia   . Hypertension   . Hyperlipidemia   . Anxiety   . Depression     Axis IV: Psychosocial, being lonely stress at work   Treatment Plan and Summary: Start Lexapro 5 mg discussed interview side effects. In case needed we can increase the dose. I do recommend therapy possible social anxiety disorder  aggravating his anxiety. Pertinent Labs and Relevant Prior Notes reviewed. Medication Side effects, benefits and risks reviewed/discussed with Patient. Time given for patient to respond and asks questions regarding the Diagnosis and Medications. Safety concerns and to report to ER if suicidal or call 911. Relevant Medications refilled or called in to pharmacy. Discussed weight maintenance and Sleep Hygiene. Follow up with Primary care provider in regards to Medical conditions. Recommend compliance with medications and follow up office appointments. Discussed to avail opportunity to consider or/and continue Individual therapy with Counselor. Greater than 50% of time was spend in counseling and coordination of care with the patient.  Schedule for Follow up visit in 4 weeks or call in earlier as necessary.   Merian Capron, MD 05/12/2014

## 2014-05-14 ENCOUNTER — Other Ambulatory Visit: Payer: Self-pay | Admitting: Sports Medicine

## 2014-05-14 DIAGNOSIS — F329 Major depressive disorder, single episode, unspecified: Secondary | ICD-10-CM

## 2014-05-14 DIAGNOSIS — F32A Depression, unspecified: Secondary | ICD-10-CM

## 2014-05-19 ENCOUNTER — Ambulatory Visit: Payer: Self-pay | Admitting: Sports Medicine

## 2014-06-07 ENCOUNTER — Ambulatory Visit (INDEPENDENT_AMBULATORY_CARE_PROVIDER_SITE_OTHER): Payer: 59 | Admitting: Psychiatry

## 2014-06-07 ENCOUNTER — Encounter (HOSPITAL_COMMUNITY): Payer: Self-pay | Admitting: Psychiatry

## 2014-06-07 VITALS — Ht 69.0 in | Wt 189.0 lb

## 2014-06-07 DIAGNOSIS — F331 Major depressive disorder, recurrent, moderate: Secondary | ICD-10-CM

## 2014-06-07 DIAGNOSIS — F411 Generalized anxiety disorder: Secondary | ICD-10-CM

## 2014-06-07 MED ORDER — BUPROPION HCL 100 MG PO TABS: 100.0000 mg | ORAL_TABLET | Freq: Every morning | ORAL | Status: DC

## 2014-06-07 MED ORDER — ESCITALOPRAM OXALATE 5 MG PO TABS: 5.0000 mg | ORAL_TABLET | Freq: Every day | ORAL | Status: DC

## 2014-06-07 NOTE — Progress Notes (Signed)
Patient ID: Seth Waters, male   DOB: 1971-08-19, 43 y.o.   MRN: [id number]  [facility] Follow up Visit  Tacuma Graffam [id number] 43 y.o.  06/07/2014 10:06 AM  Chief Complaint:  Depression and anxiety  History of Present Illness:   Patient Presents for Initial Evaluation with symptoms of feeling down, dysthymic. Has lost interest in usual things he enjoyed in past like hiking, running. Says job has been stressful. 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 Endorses disturbed sleep, energy, at times crying spells and loss of interest. No suicidal or homicidal toughts. Also endorses anxiety, worries , sometimes excessive over unreasonable. He feels at night has difficulty sleeping because of her worries muscle tensing and low-grade depression including feeling of onset daily.  At times he has had panic attacks which may or may not be related to some trigger. Apparently he was bullied when he was in high school and left 10 grade  and finished his GED privately. At times he has flashbacks about those days and the triggers that remind him off those and that causes him anxiety. He is developed the anxiety towards talking with strangers or in a large crowds and he feels his heart may start drinking more faster during that time.  He has been sober on marijuana and alcohol for the last 2-1/2 years. In the past he was drinking more so over the weekend and  marijuana pretty much regularly every day. There's been episodes that in that he  has been sober but then most of time since his teenage years he has been using marijuana.  On evaluation today feels lexapro has helped some and disconnects the worries. On the other hand he is having difficulty at work, feels co workers have led him to get into corrective action despite working long hours. He feels job stress has added to his  stress. Also concerned about sexual side effect on lexapro and wants to discuss options.   Severity of depression; 6/10. 10 being not depressed.   Context; stress at work feeling lonely at times.  Modifying factors; hiking running her meeting the family  Associated symptoms; disturbed sleep energy. Feeling of hopelessness and despair along with anxiety and panic symptoms at times. There is no associated manic like symptoms psychotic symptoms delusions or hallucinations.   Past Psychiatric History/Hospitalization(s) Denies. She has had treatment with Paxil in the past that Paxil caused him diarrhea and also decreased sexual desire for which he did not continue. Never had any inpatient treatment.   Medical History; Past Medical History  Diagnosis Date  . Hyperlipidemia   . Hypertension   . Hyperlipidemia   . Anxiety   . Depression     Allergies: Allergies  Allergen Reactions  . Simvastatin Swelling    Medications: Outpatient Encounter Prescriptions as of 06/07/2014  Medication Sig  . AMBULATORY NON FORMULARY MEDICATION Needs hydrotherapy tub.  Marland Kitchen buPROPion (WELLBUTRIN) 100 MG tablet Take 1 tablet (100 mg total) by mouth every morning.  Marland Kitchen buPROPion (ZYBAN) 150 MG 12 hr tablet 1 tab PO qd x3d, then BID, d/c smoking after the first 7d.  . clotrimazole-betamethasone (LOTRISONE) cream Apply 1 application topically 2 (two) times daily.  Marland Kitchen escitalopram (LEXAPRO) 5 MG tablet Take 1 tablet (5 mg total) by mouth daily.  . rosuvastatin (CRESTOR) 40 MG tablet Take 1 tablet (40 mg total) by mouth daily.  . [  DISCONTINUED] escitalopram (LEXAPRO) 5 MG tablet Take 1 tablet (5 mg total) by mouth daily.     Substance Abuse History: Marijuana use since age 62 onwards total 2-1/2 years ago. Has been times that he has been sober but overall regular use of marijuana. Alcohol use also since late teenage years he has been sober since last 2 and half years. He has had 3 DUIs in the past  Family  History; Family History  Problem Relation Age of Onset  . Hyperlipidemia Mother   . Hypertension Mother   . Alcohol abuse Father    Father with depression.   Biopsychosocial History:  Grew up with Parents. Dad was alcoholic and verbally abusive. He was bullied in high school and left in 1oth grad. Finished GEd after. 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 History of having DUIs in the past.  Labs:  No results found for this or any previous visit (from the past 2160 hour(s)).  Musculoskeletal: Strength & Muscle Tone: within normal limits Gait & Station: normal Patient leans: N/A  Mental Status Examination;   Psychiatric Specialty Exam: Physical Exam  Vitals reviewed. Constitutional: He appears well-developed and well-nourished. No distress.    Review of Systems  Constitutional: Negative.   Respiratory: Negative for cough.   Gastrointestinal: Negative for nausea.  Musculoskeletal: Negative for myalgias.  Neurological: Negative for dizziness, tremors and headaches.  Psychiatric/Behavioral: Positive for depression. Negative for suicidal ideas, hallucinations, memory loss and substance abuse. The patient is nervous/anxious. The patient does not have insomnia.     Height 5\' 9"  (1.753 m), weight 189 lb (85.73 kg).Body mass index is 27.9 kg/(m^2).  General Appearance: Casual  Eye Contact::  Fair  Speech:  Slow  Volume:  Normal  Mood:  Dysphoric  Affect:  Congruent  Thought Process:  Coherent  Orientation:  Full (Time, Place, and Person)  Thought Content:  Rumination  Suicidal Thoughts:  No  Homicidal Thoughts:  No  Memory:  Immediate;   Fair Recent;   Fair  Judgement:  Intact  Insight:  Shallow  Psychomotor Activity:  Decreased  Concentration:  Fair  Recall:  Fair  Akathisia:  Negative  Handed:  Right  AIMS (if indicated):     Assets:  Desire for Improvement Leisure Time Physical Health Resilience  Sleep:         Assessment: Axis I: Maj. depressive disorder, recurrent mild, moderate. Generalized anxiety disorder  Axis II: Deferred  Axis III:  Past Medical History  Diagnosis Date  . Hyperlipidemia   . Hypertension   . Hyperlipidemia   . Anxiety   . Depression     Axis IV: Psychosocial, being lonely stress at work   Treatment Plan and Summary: Continue lexapro 5mg .  Add wellbutrin 100mg  for sexual side effect and he may consider to stop smoking.  Recommend he has balanced hours of work. Has not taken vacation for a long time. Continue AA and has remained sober.  Medication Side effects, benefits and risks reviewed/discussed with Patient. Time given for patient to respond and asks questions regarding the Diagnosis and Medications. Safety concerns and to report to ER if suicidal or call 911. Relevant Medications refilled or called in to pharmacy. Discussed weight maintenance and Sleep Hygiene. Follow up with Primary care provider in regards to Medical conditions. Recommend compliance with medications and follow up office appointments. Discussed to avail opportunity to consider or/and continue Individual therapy with Counselor. Greater than 50% of  time was spend in counseling and coordination of care with the patient.  Schedule for Follow up visit in 4 weeks or call in earlier as necessary.   Merian Capron, MD 06/07/2014

## 2014-07-04 ENCOUNTER — Ambulatory Visit (HOSPITAL_COMMUNITY): Payer: Self-pay | Admitting: Psychiatry

## 2014-07-08 ENCOUNTER — Other Ambulatory Visit (HOSPITAL_COMMUNITY): Payer: Self-pay | Admitting: Neurological Surgery

## 2014-07-08 DIAGNOSIS — M5412 Radiculopathy, cervical region: Secondary | ICD-10-CM

## 2014-07-11 ENCOUNTER — Ambulatory Visit (HOSPITAL_COMMUNITY)
Admission: RE | Admit: 2014-07-11 | Discharge: 2014-07-11 | Disposition: A | Discharge status: Home / Self Care | Payer: 59 | Source: Ambulatory Visit | Attending: Neurological Surgery | Admitting: Neurological Surgery

## 2014-07-12 ENCOUNTER — Encounter (HOSPITAL_COMMUNITY): Payer: Self-pay | Admitting: Psychiatry

## 2014-07-12 ENCOUNTER — Ambulatory Visit (INDEPENDENT_AMBULATORY_CARE_PROVIDER_SITE_OTHER): Payer: 59 | Admitting: Psychiatry

## 2014-07-12 VITALS — BP 119/90 | HR 88 | Ht 69.0 in | Wt 195.0 lb

## 2014-07-12 DIAGNOSIS — F331 Major depressive disorder, recurrent, moderate: Secondary | ICD-10-CM

## 2014-07-12 DIAGNOSIS — F411 Generalized anxiety disorder: Secondary | ICD-10-CM

## 2014-07-12 MED ORDER — BUPROPION HCL 100 MG PO TABS: 150.0000 mg | ORAL_TABLET | Freq: Every morning | ORAL | Status: DC

## 2014-07-12 MED ORDER — ESCITALOPRAM OXALATE 5 MG PO TABS: 5.0000 mg | ORAL_TABLET | Freq: Every day | ORAL | Status: DC

## 2014-07-12 NOTE — Progress Notes (Signed)
Patient ID: Shiraz Bastyr, male   DOB: 10-17-1970, 43 y.o.   MRN: [id number]  [facility] Follow up Visit  Kaylib Furness [id number] 42 y.o.  07/12/2014 10:41 AM  Chief Complaint:  Depression and anxiety  History of Present Illness:   Patient Presents for Initial Evaluation with symptoms of feeling down, dysthymic. Has lost interest in usual things he enjoyed in past like hiking, running. Says job has been stressful. He is currently single works in cardiac lab at [employer] has helped his depression and anxiety. Last visit we added Wellbutrin because of some concern about side effects that his sexual and also decreased energy. He has noticed increased energy and the decreased sexual side effects. He is also cut down on his smoking. He continues to struggle at work because of not getting along with coworkers overall energy and appetite is adequate.  No significant panic attacks but he does worry at times and worries are excessive when he is looking forward for the holidays.   He has been sober on marijuana and alcohol for the last 2-1/2 years. In the past he was drinking more so over the weekend and  marijuana pretty much regularly every day. He is sober now for the last 2-1/2 years.   Severity of depression; 7/10. 10 being not depressed.   Context; stress at work feeling lonely at times.  Modifying factors; hiking running her meeting the family  Associated symptoms; disturbed sleep energy. Feeling of hopelessness and despair along with anxiety and panic symptoms at times. There is no associated manic like symptoms psychotic symptoms delusions or hallucinations.   Past Psychiatric History/Hospitalization(s) Denies. She has had treatment with Paxil in the past that Paxil caused him diarrhea and also decreased sexual desire for which he did not continue. Never had any inpatient treatment.   Medical History; Past Medical History  Diagnosis Date  .  Hyperlipidemia   . Hypertension   . Hyperlipidemia   . Anxiety   . Depression     Allergies: Allergies  Allergen Reactions  . Simvastatin Swelling    Medications: Outpatient Encounter Prescriptions as of 07/12/2014  Medication Sig  . AMBULATORY NON FORMULARY MEDICATION Needs hydrotherapy tub.  Marland Kitchen buPROPion (WELLBUTRIN) 100 MG tablet Take 1.5 tablets (150 mg total) by mouth every morning.  Marland Kitchen buPROPion (ZYBAN) 150 MG 12 hr tablet 1 tab PO qd x3d, then BID, d/c smoking after the first 7d.  . clotrimazole-betamethasone (LOTRISONE) cream Apply 1 application topically 2 (two) times daily.  Marland Kitchen escitalopram (LEXAPRO) 5 MG tablet Take 1 tablet (5 mg total) by mouth daily.  . rosuvastatin (CRESTOR) 40 MG tablet Take 1 tablet (40 mg total) by mouth daily.  . [DISCONTINUED] buPROPion (WELLBUTRIN) 100 MG tablet Take 1 tablet (100 mg total) by mouth every morning.  . [DISCONTINUED] escitalopram (LEXAPRO) 5 MG tablet Take 1 tablet (5 mg total) by mouth daily.     Substance Abuse History: Marijuana use since age 36 onwards total 2-1/2 years ago. Has been times that he has been sober but overall regular use of marijuana. Alcohol use also since late teenage years he has been sober since last 2 and half years. He has had 3 DUIs in the past  Family History; Family History  Problem Relation Age of Onset  . Hyperlipidemia Mother   . Hypertension Mother   . Alcohol abuse Father    Father with depression.   Labs:  No results found for this or any previous visit (from  the past 2160 hour(s)).  Musculoskeletal: Strength & Muscle Tone: within normal limits Gait & Station: normal Patient leans: N/A  Mental Status Examination;   Psychiatric Specialty Exam: Physical Exam  Vitals reviewed. Constitutional: He appears well-developed and well-nourished. No distress.    Review of Systems  Respiratory: Negative for cough.   Cardiovascular: Negative for chest pain.  Musculoskeletal: Negative for  myalgias.  Neurological: Negative for tremors and headaches.  Psychiatric/Behavioral: Negative for depression, suicidal ideas, hallucinations, memory loss and substance abuse. The patient is nervous/anxious. The patient does not have insomnia.     Blood pressure 119/90, pulse 88, height 5\' 9"  (1.753 m), weight 195 lb (88.451 kg).Body mass index is 28.78 kg/(m^2).  General Appearance: Casual  Eye Contact::  Fair  Speech:  Slow  Volume:  Normal  Mood:  Dysphoric  Affect:  Congruent  Thought Process:  Coherent  Orientation:  Full (Time, Place, and Person)  Thought Content:  Rumination  Suicidal Thoughts:  No  Homicidal Thoughts:  No  Memory:  Immediate;   Fair Recent;   Fair  Judgement:  Intact  Insight:  Shallow  Psychomotor Activity:  Decreased  Concentration:  Fair  Recall:  Fair  Akathisia:  Negative  Handed:  Right  AIMS (if indicated):     Assets:  Desire for Improvement Leisure Time Physical Health Resilience  Sleep:        Assessment: Axis I: Maj. depressive disorder, recurrent mild, moderate. Generalized anxiety disorder  Axis II: Deferred  Axis III:  Past Medical History  Diagnosis Date  . Hyperlipidemia   . Hypertension   . Hyperlipidemia   . Anxiety   . Depression     Axis IV: Psychosocial, being lonely stress at work   Treatment Plan and Summary: Continue Lexapro 5 mg. Increase Wellbutrin to 150 mg as he feels that it is to be further increased to help his depression and to help his smoking. He has cut down the smoking only does a few at night.   Recommend he has balanced hours of work. Has not taken vacation for a long time. Continue AA and has remained sober.  Medication Side effects, benefits and risks reviewed/discussed with Patient. Time given for patient to respond and asks questions regarding the Diagnosis and Medications. Safety concerns and to report to ER if suicidal or call 911. Relevant Medications refilled or called in to  pharmacy. Discussed weight maintenance and Sleep Hygiene. Follow up with Primary care provider in regards to Medical conditions. Recommend compliance with medications and follow up office appointments. Discussed to avail opportunity to consider or/and continue Individual therapy with Counselor. Greater than 50% of time was spend in counseling and coordination of care with the patient.  Schedule for Follow up visit in 4 weeks or call in earlier as necessary.   Merian Capron, MD 07/12/2014

## 2014-07-28 ENCOUNTER — Ambulatory Visit (INDEPENDENT_AMBULATORY_CARE_PROVIDER_SITE_OTHER): Payer: 59 | Admitting: Sports Medicine

## 2014-07-28 ENCOUNTER — Encounter: Payer: Self-pay | Admitting: Sports Medicine

## 2014-07-28 VITALS — BP 124/84 | HR 63 | Ht 69.0 in | Wt 192.0 lb

## 2014-07-28 DIAGNOSIS — M722 Plantar fascial fibromatosis: Secondary | ICD-10-CM

## 2014-07-28 NOTE — Progress Notes (Signed)

## 2014-07-28 NOTE — Assessment & Plan Note (Signed)
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 Another set of custom orthotics were made today.

## 2014-09-12 ENCOUNTER — Ambulatory Visit (HOSPITAL_COMMUNITY): Payer: Self-pay | Admitting: Psychiatry

## 2014-09-20 ENCOUNTER — Encounter (HOSPITAL_COMMUNITY): Payer: Self-pay | Admitting: Psychiatry

## 2014-09-20 ENCOUNTER — Ambulatory Visit (INDEPENDENT_AMBULATORY_CARE_PROVIDER_SITE_OTHER): Payer: 59 | Admitting: Psychiatry

## 2014-09-20 VITALS — BP 128/80 | HR 84 | Ht 69.0 in | Wt 199.0 lb

## 2014-09-20 DIAGNOSIS — F411 Generalized anxiety disorder: Secondary | ICD-10-CM

## 2014-09-20 DIAGNOSIS — F331 Major depressive disorder, recurrent, moderate: Secondary | ICD-10-CM

## 2014-09-20 MED ORDER — BUPROPION HCL 100 MG PO TABS: 150.0000 mg | ORAL_TABLET | Freq: Every morning | ORAL | Status: DC

## 2014-09-20 MED ORDER — ESCITALOPRAM OXALATE 5 MG PO TABS: 5.0000 mg | ORAL_TABLET | Freq: Every day | ORAL | Status: DC

## 2014-09-20 MED ORDER — BUSPIRONE HCL 7.5 MG PO TABS: 7.5000 mg | ORAL_TABLET | Freq: Two times a day (BID) | ORAL | Status: DC

## 2014-09-20 NOTE — Progress Notes (Signed)
Patient ID: RHONIN TROTT, male   DOB: 1971/01/11, 44 y.o.   MRN: [id number]  [facility] Follow up Outpatient Visit  LORING LISKEY [id number] 44 y.o.  09/20/2014 10:40 AM  Chief Complaint:  Depression and anxiety  History of Present Illness:   Patient Presents for follow up and medication management for depression and anxiety. Initially has presented with  lost interest in usual things he enjoyed in past like hiking, running. Says job has been stressful. He is currently single works in cardiac lab at [employer] has helped some with  his depression and anxiety. Wellbutrin was added for depression. He can to staff significant stress related with his coworkers says that it is antagonizing and most of them are male now he is expecting to have a meeting with the supervisor placed tense about the meeting because last meeting he got somewhat mad.  No significant panic attacks but he does worry at times and worries are excessive and related to job stress.   He has been sober on marijuana and alcohol for the last 3 years. In the past he was drinking more so over the weekend and  marijuana pretty much regularly every day. He is sober now for the last 2-1/2 years.   Severity of depression; 6/10. 10 being not depressed.   Context; stress at work feeling lonely at times.  Modifying factors; hiking running her meeting the family  Associated symptoms; disturbed sleep energy. Feeling of hopelessness and despair along with anxiety and panic symptoms at times. There is no associated manic like symptoms psychotic symptoms delusions or hallucinations.   Past Psychiatric History/Hospitalization(s) Denies. She has had treatment with Paxil in the past that Paxil caused him diarrhea and also decreased sexual desire for which he did not continue. Never had any inpatient treatment.   Medical History; Past Medical History  Diagnosis Date  . Hyperlipidemia   .  Hypertension   . Hyperlipidemia   . Anxiety   . Depression     Allergies: Allergies  Allergen Reactions  . Simvastatin Swelling    Medications: Outpatient Encounter Prescriptions as of 09/20/2014  Medication Sig  . AMBULATORY NON FORMULARY MEDICATION Needs hydrotherapy tub.  Marland Kitchen buPROPion (WELLBUTRIN) 100 MG tablet Take 1.5 tablets (150 mg total) by mouth every morning.  Marland Kitchen buPROPion (ZYBAN) 150 MG 12 hr tablet 1 tab PO qd x3d, then BID, d/c smoking after the first 7d.  . busPIRone (BUSPAR) 7.5 MG tablet Take 1 tablet (7.5 mg total) by mouth 2 (two) times daily.  . clotrimazole-betamethasone (LOTRISONE) cream Apply 1 application topically 2 (two) times daily.  Marland Kitchen escitalopram (LEXAPRO) 5 MG tablet Take 1 tablet (5 mg total) by mouth daily.  . rosuvastatin (CRESTOR) 40 MG tablet Take 1 tablet (40 mg total) by mouth daily.  . [DISCONTINUED] buPROPion (WELLBUTRIN) 100 MG tablet Take 1.5 tablets (150 mg total) by mouth every morning.  . [DISCONTINUED] escitalopram (LEXAPRO) 5 MG tablet Take 1 tablet (5 mg total) by mouth daily.     Substance Abuse History: Marijuana use since age 26 onwards total 2-1/2 years ago. Has been times that he has been sober but overall regular use of marijuana. Alcohol use also since late teenage years he has been sober since last 2 and half years. He has had 3 DUIs in the past  Family History; Family History  Problem Relation Age of Onset  . Hyperlipidemia Mother   . Hypertension Mother   . Alcohol abuse Father  Father with depression.   Labs:  No results found for this or any previous visit (from the past 2160 hour(s)).  Musculoskeletal: Strength & Muscle Tone: within normal limits Gait & Station: normal Patient leans: N/A  Mental Status Examination;   Psychiatric Specialty Exam: Physical Exam  Vitals reviewed. Constitutional: He appears well-developed and well-nourished. No distress.    Review of Systems  Constitutional: Negative.    Neurological: Negative for tremors.  Psychiatric/Behavioral: Negative for suicidal ideas and substance abuse. The patient is nervous/anxious.     Blood pressure 128/80, pulse 84, height 5\' 9"  (1.753 m), weight 199 lb (90.266 kg).Body mass index is 29.37 kg/(m^2).  General Appearance: Casual  Eye Contact::  Fair  Speech:  Slow  Volume:  Normal  Mood:  Dysphoric  Affect:  Congruent  Thought Process:  Coherent  Orientation:  Full (Time, Place, and Person)  Thought Content:  Rumination  Suicidal Thoughts:  No  Homicidal Thoughts:  No  Memory:  Immediate;   Fair Recent;   Fair  Judgement:  Intact  Insight:  Shallow  Psychomotor Activity:  Decreased  Concentration:  Fair  Recall:  Fair  Akathisia:  Negative  Handed:  Right  AIMS (if indicated):     Assets:  Desire for Improvement Leisure Time Physical Health Resilience  Sleep:        Assessment: Axis I: Maj. depressive disorder, recurrent mild, moderate. Generalized anxiety disorder  Axis II: Deferred  Axis III:  Past Medical History  Diagnosis Date  . Hyperlipidemia   . Hypertension   . Hyperlipidemia   . Anxiety   . Depression     Axis IV: Psychosocial, being lonely stress at work   Treatment Plan and Summary:  Does not want to increase the Lexapro that I recommended. He can adjust the dose to evening if he is feeling tired with the Lexapro 500.  Continue Wellbutrin 150 mg.  He was to start on buspirone I mentioned that her adding another medication although we should try working on increasing dose of Lexapro. He feels comfortable with the buspirone adult and he is going to see how it helps to anxiety. We'll start him on buspirone 7.5mg  twice a day Need to reschedule appointment SR or counselor so that he can work on the stress related with his job. cautioned about the increase serotonin level and side effects including headache.  Recommend he has balanced hours of work. Has not taken vacation for a long  time. Continue AA and has remained sober.  Medication Side effects, benefits and risks reviewed/discussed with Patient. Time given for patient to respond and asks questions regarding the Diagnosis and Medications. Safety concerns and to report to ER if suicidal or call 911. Relevant Medications refilled or called in to pharmacy. Discussed weight maintenance and Sleep Hygiene. Follow up with Primary care provider in regards to Medical conditions. Recommend compliance with medications and follow up office appointments. Discussed to avail opportunity to consider or/and continue Individual therapy with Counselor. Greater than 50% of time was spend in counseling and coordination of care with the patient.  Schedule for Follow up visit in 4 weeks or call in earlier as necessary.   Merian Capron, MD 09/20/2014

## 2014-11-10 ENCOUNTER — Ambulatory Visit (HOSPITAL_COMMUNITY): Payer: Self-pay | Admitting: Psychiatry

## 2014-11-21 ENCOUNTER — Emergency Department
Admission: EM | Admit: 2014-11-21 | Discharge: 2014-11-21 | Disposition: A | Discharge status: Home / Self Care | Payer: 59 | Source: Home / Self Care | Attending: Family Medicine | Admitting: Family Medicine

## 2014-11-21 ENCOUNTER — Encounter: Payer: Self-pay | Admitting: *Deleted

## 2014-11-21 DIAGNOSIS — H8113 Benign paroxysmal vertigo, bilateral: Secondary | ICD-10-CM

## 2014-11-21 DIAGNOSIS — H8111 Benign paroxysmal vertigo, right ear: Secondary | ICD-10-CM

## 2014-11-21 MED ORDER — MECLIZINE HCL 25 MG PO TABS: ORAL_TABLET | ORAL | Status: DC

## 2014-11-21 NOTE — ED Provider Notes (Signed)
CSN: [id number]     Arrival date & time 11/21/14  1539 History   First MD Initiated Contact with Patient 11/21/14 1559     Chief Complaint  Patient presents with  . Dizziness      HPI Comments: While in bed, patient turned his head suddenly to the right.  Afterwards he has had persistent sensation of dizziness and being off balance with changes in position.  The dizziness decreases when he maintains his position.  No headache.  No neurologic symptoms.  No fevers, chills, and sweats.  No recent URI.  Patient is a 44 y.o. male presenting with dizziness. The history is provided by the patient.  Dizziness Quality:  Head spinning and imbalance Severity:  Mild Onset quality:  Sudden Duration:  1 day Timing:  Intermittent Progression:  Unchanged Chronicity:  New Context: bending over, head movement and standing up   Relieved by:  Being still Worsened by:  Movement, standing up and turning head Ineffective treatments:  None tried Associated symptoms: no headaches, no hearing loss, no nausea, no palpitations, no shortness of breath, no syncope, no vision changes, no vomiting and no weakness     Past Medical History  Diagnosis Date  . Hyperlipidemia   . Hypertension   . Hyperlipidemia   . Anxiety   . Depression    Past Surgical History  Procedure Laterality Date  . Hernia repair    . High cholesterol     Family History  Problem Relation Age of Onset  . Hyperlipidemia Mother   . Hypertension Mother   . Alcohol abuse Father    History  Substance Use Topics  . Smoking status: Current Every Day Smoker -- 0.25 packs/day for 15 years    Types: Cigarettes  . Smokeless tobacco: Former Systems developer    Quit date: 10/11/2011  . Alcohol Use: No    Review of Systems  HENT: Negative for hearing loss.   Respiratory: Negative for shortness of breath.   Cardiovascular: Negative for palpitations and syncope.  Gastrointestinal: Negative for nausea and vomiting.  Neurological: Positive for  dizziness. Negative for weakness and headaches.  All other systems reviewed and are negative.   Allergies  Simvastatin  Home Medications   Prior to Admission medications   Medication Sig Start Date End Date Taking? Authorizing Provider  busPIRone (BUSPAR) 7.5 MG tablet Take 1 tablet (7.5 mg total) by mouth 2 (two) times daily. 09/20/14  Yes Merian Capron, MD  rosuvastatin (CRESTOR) 40 MG tablet Take 1 tablet (40 mg total) by mouth daily. 01/14/14  Yes Silverio Decamp, MD  buPROPion (WELLBUTRIN) 100 MG tablet Take 1.5 tablets (150 mg total) by mouth every morning. 09/20/14   Merian Capron, MD  buPROPion (ZYBAN) 150 MG 12 hr tablet 1 tab PO qd x3d, then BID, d/c smoking after the first 7d. 04/14/14   Silverio Decamp, MD  clotrimazole-betamethasone (LOTRISONE) cream Apply 1 application topically 2 (two) times daily. 11/01/13   Silverio Decamp, MD  escitalopram (LEXAPRO) 5 MG tablet Take 1 tablet (5 mg total) by mouth daily. 09/20/14   Merian Capron, MD  meclizine (ANTIVERT) 25 MG tablet Take one tab by mouth 2 or 3 times daily as needed for dizziness 11/21/14   Kandra Nicolas, MD   BP 123/82 mmHg  Pulse 61  Temp(Src) 98.3 F (36.8 C) (Oral)  Resp 14  Wt 190 lb (86.183 kg)  SpO2 99% Physical Exam Nursing notes and Vital Signs reviewed. Appearance:  Patient appears stated age,  and in no acute distress Eyes:  Pupils are equal, round, and reactive to light and accomodation.  Extraocular movement is intact.  Conjunctivae are not inflamed.  Fundi benign.  No nystagmus. Ears:  Canals normal.  Tympanic membranes normal.  Nose:   Normal turbinates.  No sinus tenderness.   Pharynx:  Normal Neck:  Supple.  No adenopathy.  Carotids have normal upstrokes. Lungs:  Clear to auscultation.  Breath sounds are equal.  Heart:  Regular rate and rhythm without murmurs, rubs, or gallops.  Abdomen:  Nontender without masses or hepatosplenomegaly.  Bowel sounds are present.  No CVA or flank  tenderness.  Extremities:  No edema.  No calf tenderness Skin:  No rash present. Neuro:  Cranial nerves normal.   ED Course  Procedures  none   MDM   1. Benign paroxysmal positional vertigo, right    Begin Meclizine 25mg  bid to tid prn.  May try Epley's maneuver at home.   Followup with ENT if not improving.    Kandra Nicolas, MD 11/25/14 [phone]

## 2014-11-21 NOTE — ED Notes (Addendum)
Seth Waters c/o dizziness x this AM. Worse when lying or changing positions. Denies ear pain or nausea. Started a sleep aid spray 3 days ago

## 2014-11-21 NOTE — Discharge Instructions (Signed)
Benign Positional Vertigo °Vertigo means you feel like you or your surroundings are moving when they are not. Benign positional vertigo is the most common form of vertigo. Benign means that the cause of your condition is not serious. Benign positional vertigo is more common in older adults. °CAUSES  °Benign positional vertigo is the result of an upset in the labyrinth system. This is an area in the middle ear that helps control your balance. This may be caused by a viral infection, head injury, or repetitive motion. However, often no specific cause is found. °SYMPTOMS  °Symptoms of benign positional vertigo occur when you move your head or eyes in different directions. Some of the symptoms may include: °1. Loss of balance and falls. °2. Vomiting. °3. Blurred vision. °4. Dizziness. °5. Nausea. °6. Involuntary eye movements (nystagmus). °DIAGNOSIS  °Benign positional vertigo is usually diagnosed by physical exam. If the specific cause of your benign positional vertigo is unknown, your caregiver may perform imaging tests, such as magnetic resonance imaging (MRI) or computed tomography (CT). °TREATMENT  °Your caregiver may recommend movements or procedures to correct the benign positional vertigo. Medicines such as meclizine, benzodiazepines, and medicines for nausea may be used to treat your symptoms. In rare cases, if your symptoms are caused by certain conditions that affect the inner ear, you may need surgery. °HOME CARE INSTRUCTIONS  °· Follow your caregiver's instructions. °· Move slowly. Do not make sudden body or head movements. °· Avoid driving. °· Avoid operating heavy machinery. °· Avoid performing any tasks that would be dangerous to you or others during a vertigo episode. °· Drink enough fluids to keep your urine clear or pale yellow. °SEEK IMMEDIATE MEDICAL CARE IF:  °· You develop problems with walking, weakness, numbness, or using your arms, hands, or legs. °· You have difficulty speaking. °· You develop  severe headaches. °· Your nausea or vomiting continues or gets worse. °· You develop visual changes. °· Your family or friends notice any behavioral changes. °· Your condition gets worse. °· You have a fever. °· You develop a stiff neck or sensitivity to light. °MAKE SURE YOU:  °· Understand these instructions. °· Will watch your condition. °· Will get help right away if you are not doing well or get worse. °Document Released: 05/20/2006 Document Revised: 11/04/2011 Document Reviewed: 05/02/2011 °ExitCare® Patient Information ©2015 [employer]. This information is not intended to replace advice given to you by your health care provider. Make sure you discuss any questions you have with your health care provider. ° °Epley Maneuver Self-Care °WHAT IS THE EPLEY MANEUVER? °The Epley maneuver is an exercise you can do to relieve symptoms of benign paroxysmal positional vertigo (BPPV). This condition is often just referred to as vertigo. BPPV is caused by the movement of tiny crystals (canaliths) inside your inner ear. The accumulation and movement of canaliths in your inner ear causes a sudden spinning sensation (vertigo) when you move your head to certain positions. Vertigo usually lasts about 30 seconds. BPPV usually occurs in just one ear. If you get vertigo when you lie on your left side, you probably have BPPV in your left ear. Your health care provider can tell you which ear is involved.  °BPPV may be caused by a head injury. Many people older than 50 get BPPV for unknown reasons. If you have been diagnosed with BPPV, your health care provider may teach you how to do this maneuver. BPPV is not life threatening (benign) and usually goes away in time.  °  WHEN SHOULD I PERFORM THE EPLEY MANEUVER? °You can do this maneuver at home whenever you have symptoms of vertigo. You may do the Epley maneuver up to 3 times a day until your symptoms of vertigo go away. °HOW SHOULD I DO THE EPLEY MANEUVER? °7. Sit on the edge of a  bed or table with your back straight. Your legs should be extended or hanging over the edge of the bed or table.   °8. Turn your head halfway toward the affected ear.   °9. Lie backward quickly with your head turned until you are lying flat on your back. You may want to position a pillow under your shoulders.   °10. Hold this position for 30 seconds. You may experience an attack of vertigo. This is normal. Hold this position until the vertigo stops. °11. Then turn your head to the opposite direction until your unaffected ear is facing the floor.   °12. Hold this position for 30 seconds. You may experience an attack of vertigo. This is normal. Hold this position until the vertigo stops. °13. Now turn your whole body to the same side as your head. Hold for another 30 seconds.   °14. You can then sit back up. °ARE THERE RISKS TO THIS MANEUVER? °In some cases, you may have other symptoms (such as changes in your vision, weakness, or numbness). If you have these symptoms, stop doing the maneuver and call your health care provider. Even if doing these maneuvers relieves your vertigo, you may still have dizziness. Dizziness is the sensation of light-headedness but without the sensation of movement. Even though the Epley maneuver may relieve your vertigo, it is possible that your symptoms will return within 5 years. °WHAT SHOULD I DO AFTER THIS MANEUVER? °After doing the Epley maneuver, you can return to your normal activities. Ask your doctor if there is anything you should do at home to prevent vertigo. This may include: °· Sleeping with two or more pillows to keep your head elevated. °· Not sleeping on the side of your affected ear. °· Getting up slowly from bed. °· Avoiding sudden movements during the day. °· Avoiding extreme head movement, like looking up or bending over. °· Wearing a cervical collar to prevent sudden head movements. °WHAT SHOULD I DO IF MY SYMPTOMS GET WORSE? °Call your health care provider if your  vertigo gets worse. Call your provider right way if you have other symptoms, including:  °· Nausea. °· Vomiting. °· Headache. °· Weakness. °· Numbness. °· Vision changes. °Document Released: 08/17/2013 Document Reviewed: 08/17/2013 °ExitCare® Patient Information ©2015 [employer]. This information is not intended to replace advice given to you by your health care provider. Make sure you discuss any questions you have with your health care provider. ° °

## 2014-11-25 ENCOUNTER — Telehealth: Payer: Self-pay | Admitting: Emergency Medicine

## 2014-11-27 ENCOUNTER — Encounter: Payer: Self-pay | Admitting: Emergency Medicine

## 2014-11-27 ENCOUNTER — Emergency Department
Admission: EM | Admit: 2014-11-27 | Discharge: 2014-11-27 | Disposition: A | Discharge status: Home / Self Care | Payer: 59 | Source: Home / Self Care | Attending: Emergency Medicine | Admitting: Emergency Medicine

## 2014-11-27 DIAGNOSIS — H8110 Benign paroxysmal vertigo, unspecified ear: Secondary | ICD-10-CM | POA: Diagnosis not present

## 2014-11-27 MED ORDER — MECLIZINE HCL 25 MG PO TABS: ORAL_TABLET | ORAL | Status: DC

## 2014-11-27 NOTE — ED Provider Notes (Signed)
CSN: [id number]     Arrival date & time 11/27/14  1105 History   First MD Initiated Contact with Patient 11/27/14 1140     Chief Complaint  Patient presents with  . Dizziness   (Consider location/radiation/quality/duration/timing/severity/associated sxs/prior Treatment) Patient is a 44 y.o. male presenting with dizziness. The history is provided by the patient. No language interpreter was used.  Dizziness Quality:  Lightheadedness Severity:  Moderate Onset quality:  Gradual Duration:  5 days Timing:  Constant Progression:  Worsening Chronicity:  New Context: bending over and physical activity   Relieved by:  Nothing Worsened by:  Nothing Ineffective treatments:  None tried Associated symptoms: palpitations   Associated symptoms: no chest pain and no shortness of breath   Risk factors: no anemia and no hx of vertigo     Past Medical History  Diagnosis Date  . Hyperlipidemia   . Hypertension   . Hyperlipidemia   . Anxiety   . Depression    Past Surgical History  Procedure Laterality Date  . Hernia repair    . High cholesterol     Family History  Problem Relation Age of Onset  . Hyperlipidemia Mother   . Hypertension Mother   . Alcohol abuse Father    History  Substance Use Topics  . Smoking status: Current Every Day Smoker -- 0.25 packs/day for 15 years    Types: Cigarettes  . Smokeless tobacco: Former Systems developer    Quit date: 10/11/2011  . Alcohol Use: No    Review of Systems  Respiratory: Negative for shortness of breath.   Cardiovascular: Positive for palpitations. Negative for chest pain.  Neurological: Positive for dizziness.  All other systems reviewed and are negative.   Allergies  Simvastatin  Home Medications   Prior to Admission medications   Medication Sig Start Date End Date Taking? Authorizing Provider  buPROPion (WELLBUTRIN) 100 MG tablet Take 1.5 tablets (150 mg total) by mouth every morning. 09/20/14   Merian Capron, MD  buPROPion (ZYBAN) 150  MG 12 hr tablet 1 tab PO qd x3d, then BID, d/c smoking after the first 7d. 04/14/14   Silverio Decamp, MD  busPIRone (BUSPAR) 7.5 MG tablet Take 1 tablet (7.5 mg total) by mouth 2 (two) times daily. 09/20/14   Merian Capron, MD  clotrimazole-betamethasone (LOTRISONE) cream Apply 1 application topically 2 (two) times daily. 11/01/13   Silverio Decamp, MD  escitalopram (LEXAPRO) 5 MG tablet Take 1 tablet (5 mg total) by mouth daily. 09/20/14   Merian Capron, MD  meclizine (ANTIVERT) 25 MG tablet Take one tab by mouth 2 or 3 times daily as needed for dizziness 11/27/14   Fransico Meadow, PA-C  rosuvastatin (CRESTOR) 40 MG tablet Take 1 tablet (40 mg total) by mouth daily. 01/14/14   Silverio Decamp, MD   BP 109/74 mmHg  Pulse 69  Temp(Src) 98.4 F (36.9 C) (Oral)  Resp 16  Ht 5\' 9"  (1.753 m)  Wt 190 lb (86.183 kg)  BMI 28.05 kg/m2  SpO2 96% Physical Exam  Constitutional: He is oriented to person, place, and time. He appears well-developed and well-nourished.  HENT:  Head: Normocephalic and atraumatic.  Right Ear: External ear normal.  Left Ear: External ear normal.  Eyes: Conjunctivae and EOM are normal. Pupils are equal, round, and reactive to light.  Neck: Normal range of motion.  Cardiovascular: Normal rate and normal heart sounds.   Pulmonary/Chest: Effort normal.  Abdominal: He exhibits no distension.  Musculoskeletal: Normal range of motion.  Neurological: He is alert and oriented to person, place, and time.  Skin: Skin is warm.  Psychiatric: He has a normal mood and affect.  Nursing note and vitals reviewed.   ED Course  Procedures (including critical care time) Labs Review Labs Reviewed - No data to display  Imaging Review No results found.   MDM   1. Benign paroxysmal positional vertigo, unspecified laterality    Continue antivert Continue epley manuvers Schedule to see Dr. Darene Lamer this week for recheck. AVS    Fransico Meadow, PA-C 11/27/14  [place], PA-C 11/27/14 1250

## 2014-11-27 NOTE — Discharge Instructions (Signed)

## 2014-11-27 NOTE — ED Notes (Signed)
Stopped taking antidepressant rxs last week on chance they were contributing to his vertigo. Has been doing Brandt-Darof excercises.

## 2014-11-27 NOTE — ED Notes (Signed)
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

## 2014-11-28 ENCOUNTER — Telehealth: Payer: Self-pay | Admitting: Sports Medicine

## 2014-11-28 NOTE — Telephone Encounter (Signed)
Pt called and states he has a Orthotics appt Tuesday 11/29/14 and he wants to know if WHILE he is here with that , can you clear him to go back to work because he was seen while we were closed at a urgent care for Vertigo, but he is now feeling better? Call pt back and let him know. Thanks, [employer]

## 2014-11-28 NOTE — Telephone Encounter (Signed)
Yes, not a problem, he just needs to remind me to write a letter

## 2014-11-29 ENCOUNTER — Ambulatory Visit (INDEPENDENT_AMBULATORY_CARE_PROVIDER_SITE_OTHER): Payer: 59 | Admitting: Sports Medicine

## 2014-11-29 ENCOUNTER — Encounter: Payer: Self-pay | Admitting: Sports Medicine

## 2014-11-29 VITALS — BP 115/74 | HR 64 | Ht 69.0 in | Wt 191.0 lb

## 2014-11-29 DIAGNOSIS — M722 Plantar fascial fibromatosis: Secondary | ICD-10-CM | POA: Diagnosis not present

## 2014-11-29 DIAGNOSIS — E785 Hyperlipidemia, unspecified: Secondary | ICD-10-CM | POA: Diagnosis not present

## 2014-11-29 MED ORDER — ROSUVASTATIN CALCIUM 40 MG PO TABS: 40.0000 mg | ORAL_TABLET | Freq: Every day | ORAL | Status: DC

## 2014-11-29 NOTE — Assessment & Plan Note (Signed)
Refilling Crestor, checking blood work.

## 2014-11-29 NOTE — Progress Notes (Signed)

## 2014-11-29 NOTE — Assessment & Plan Note (Signed)
Custom orthotics as above. He did have an episode of benign positional vertigo, symptoms have resolved, he has Valium to use as needed, and I am going to write a return to work note. He will fill out FMLA paperwork and fax to me to simply sign.

## 2014-11-29 NOTE — Addendum Note (Signed)
Addended by: Doree Albee on: 11/29/2014 09:45 AM   Modules accepted: Orders, Medications

## 2014-12-07 ENCOUNTER — Encounter: Payer: Self-pay | Admitting: Sports Medicine

## 2014-12-07 ENCOUNTER — Ambulatory Visit (INDEPENDENT_AMBULATORY_CARE_PROVIDER_SITE_OTHER): Payer: 59 | Admitting: Sports Medicine

## 2014-12-07 VITALS — BP 110/70 | HR 59 | Wt 193.0 lb

## 2014-12-07 DIAGNOSIS — M722 Plantar fascial fibromatosis: Secondary | ICD-10-CM

## 2014-12-07 NOTE — Progress Notes (Signed)
  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  Past medical history, Surgical history, Family history not pertinant except as noted below, Social history, Allergies, and medications have been entered into the medical record, reviewed, and no changes needed.   Review of Systems: No fevers, chills, night sweats, weight loss, chest pain, or shortness of breath.   Objective:    General: Well Developed, well nourished, and in no acute distress.  Neuro: Alert and oriented x3, extra-ocular muscles intact, sensation grossly intact.  HEENT: Normocephalic, atraumatic, pupils equal round reactive to light, neck supple, no masses, no lymphadenopathy, thyroid nonpalpable.  Skin: Warm and dry, no rashes. Cardiac: Regular rate and rhythm, no murmurs rubs or gallops, no lower extremity edema.  Respiratory: Clear to auscultation bilaterally. Not using accessory muscles, speaking in full sentences.  FMLA papers filled out.  Impression and Recommendations:    I spent 15 minutes with this patient, 50% was face to face time counseling regarding BPPV and doing paperwork.

## 2014-12-07 NOTE — Assessment & Plan Note (Signed)
Symptoms completely resolved. Return as needed. He did have an episode of benign paroxysmal positional vertigo and needed some time off of work in the [employer]. FMLA paperwork filled out today. He is back to work full-time.

## 2014-12-24 LAB — COMPREHENSIVE METABOLIC PANEL WITH GFR
Albumin: 4.2 g/dL (ref 3.5–5.2)
Calcium: 9.5 mg/dL (ref 8.4–10.5)
Chloride: 103 meq/L (ref 96–112)
Creat: 0.79 mg/dL (ref 0.50–1.35)
Glucose, Bld: 99 mg/dL (ref 70–99)
Potassium: 4.9 meq/L (ref 3.5–5.3)

## 2014-12-24 LAB — VITAMIN D 25 HYDROXY (VIT D DEFICIENCY, FRACTURES): Vit D, 25-Hydroxy: 34 ng/mL (ref 30–100)

## 2014-12-24 LAB — CBC
HCT: 44.2 % (ref 39.0–52.0)
Hemoglobin: 15 g/dL (ref 13.0–17.0)
MCH: 31 pg (ref 26.0–34.0)
MCHC: 33.9 g/dL (ref 30.0–36.0)
MCV: 91.3 fL (ref 78.0–100.0)
MPV: 10.1 fL (ref 8.6–12.4)
Platelets: 238 K/uL (ref 150–400)
RBC: 4.84 MIL/uL (ref 4.22–5.81)
RDW: 13.2 % (ref 11.5–15.5)
WBC: 6 10*3/uL (ref 4.0–10.5)

## 2014-12-24 LAB — LIPID PANEL
Cholesterol: 183 mg/dL (ref 0–200)
HDL: 46 mg/dL (ref >=40)
LDL Cholesterol: 121 mg/dL — ABNORMAL HIGH (ref 0–99)
Total CHOL/HDL Ratio: 4 Ratio
Triglycerides: 81 mg/dL (ref <150)
VLDL: 16 mg/dL (ref 0–40)

## 2014-12-24 LAB — COMPREHENSIVE METABOLIC PANEL
ALT: 29 U/L (ref 0–53)
AST: 31 U/L (ref 0–37)
Alkaline Phosphatase: 70 U/L (ref 39–117)
BUN: 22 mg/dL (ref 6–23)
CO2: 27 mEq/L (ref 19–32)
Sodium: 139 mEq/L (ref 135–145)
Total Bilirubin: 0.6 mg/dL (ref 0.2–1.2)
Total Protein: 6.9 g/dL (ref 6.0–8.3)

## 2014-12-24 LAB — HEMOGLOBIN A1C
Hgb A1c MFr Bld: 5.5 % (ref <5.7)
Mean Plasma Glucose: 111 mg/dL (ref <117)

## 2014-12-24 LAB — TSH: TSH: 1.135 u[IU]/mL (ref 0.350–4.500)

## 2015-02-23 ENCOUNTER — Ambulatory Visit (INDEPENDENT_AMBULATORY_CARE_PROVIDER_SITE_OTHER): Payer: 59 | Admitting: Licensed Clinical Social Worker

## 2015-02-23 DIAGNOSIS — F411 Generalized anxiety disorder: Secondary | ICD-10-CM | POA: Diagnosis not present

## 2015-02-23 DIAGNOSIS — F331 Major depressive disorder, recurrent, moderate: Secondary | ICD-10-CM | POA: Diagnosis not present

## 2015-02-23 NOTE — Progress Notes (Signed)
Patient:   Seth Waters   DOB:   [birth year]  MR Number:  [id number]  Location:  [facility] [address] [place] [address] Dept: [phone]           Date of Service:   02/23/15  Start Time:   9:00am End Time:   10:00am  Provider/Observer:  [facility]       Billing Code/Service: [phone]  Comprehensive Clinical Assessment  Information for assessment provided by: patient   Chief Complaint:    Anxiety and depression     Presenting Problem/Symptoms:     Has been working in a hostile work environment for the past year or so.  Conditions have not improved after voicing concerns to management.   Reports difficulties with falling asleep, excessive worry, difficulties with concentration, and feelings of hopelessness.     Mental Health Symptoms:    Depression:   PHQ-9= 12 (moderate)  Current symptoms include depressed mood, anhedonia, insomnia, psychomotor agitation, fatigue, feelings of worthlessness/guilt, difficulty concentrating, hopelessness, impaired memory,.    Past episodes of depression: yes  Anxiety:  Excessive worry, feeling on edge, trouble relaxing, restlessness, irritable, feeling afraid of something awful happening  GAD-7= 15 (moderately severe)    Self-Harm Potential: Thoughts of Self-Harm: none Is there a family history of suicide? no Previous attempts? no   Dangerousness to Others Potential: Denies Family history of violence? no Previous attempts? no    Mania/hypomania: na    Psychosis:  na    Abuse/Trauma History: denies  PTSD symptoms: na          Mental Status  Interactions:    Active   Attention:   Good  Memory:   Some concerns about short term memory  Speech:   Normal   Flow of Thought:  Normal  Thought Content:  Rumination  Orientation:   person, place and  situation  Judgment:   Good  Affect/Mood:   Frustrated  Insight:   Present        Medical History:    Past Medical History  Diagnosis Date  . Hyperlipidemia   . Hypertension   . Hyperlipidemia   . Anxiety   . Depression      Current medications:         Outpatient Encounter Prescriptions as of 02/23/2015  Medication Sig  . rosuvastatin (CRESTOR) 40 MG tablet Take 1 tablet (40 mg total) by mouth daily.   No facility-administered encounter medications on file as of 02/23/2015.              Mental Health/Substance Use Treatment History:    Has been seeing a therapist at [facility] throughout the past year.  Last saw her a few weeks ago.  Has been helpful.          Family Med/Psych History:  Family History  Problem Relation Age of Onset  . Hyperlipidemia Mother   . Hypertension Mother   . Alcohol abuse Father     Alcohol abuse on both sides of the family   Substance Use History:  Sober for 4 years Goes to [employer] and has a sponser "I drank a lot and smoked a lot of pot.  It started when I was a teenager.  It became a habit."   Currently smokes cigarettes at night.  Says he would like to quit.    Marital Status: single  "Relationships have been a struggle  in my life"   Lives with: had a roommate, but he is moving out soon  Family Relationships:  Parents are together and live in [place].  Good relationship with both.  Communicate at least once a week.   Younger brother lives in [place].  "We talk once in a while."   Other Social Supports: Has some friends from [place].  Some friends he goes hiking with.  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  Several coworkers have been hard to get along with.  They made complaints about him that were not true.  Expressed anger.  Got some corrective action. Hadn't taken a vacation in over 3 years  until May of this year.  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    Hobbies:  Has been going to the gym.  Hiking, camping.  Strengths/Protective Factors: "Most people think I'm a nice guy"          Impression/DX: F41.1 Generalized Anxiety Disorder                                      F33.1 Major Depressive Disorder, recurrent, moderate     Disposition/Plan:  Symptoms seem to be caused by working in a stressful work environment.  Advised to consider finding a different place of employment.  Recommend continuing individual therapy with current therapist.

## 2015-04-24 ENCOUNTER — Ambulatory Visit (HOSPITAL_COMMUNITY): Payer: Self-pay | Admitting: Psychiatry

## 2015-11-28 MED FILL — ROSUVASTATIN CALCIUM 40 MG: 40 | 90 days supply | Qty: 90 | Fill #2

## 2015-12-08 ENCOUNTER — Emergency Department (INDEPENDENT_AMBULATORY_CARE_PROVIDER_SITE_OTHER)
Admission: EM | Admit: 2015-12-08 | Discharge: 2015-12-08 | Disposition: A | Discharge status: Home / Self Care | Payer: 59 | Source: Home / Self Care | Attending: Family Medicine | Admitting: Family Medicine

## 2015-12-08 ENCOUNTER — Encounter: Payer: Self-pay | Admitting: Emergency Medicine

## 2015-12-08 DIAGNOSIS — F411 Generalized anxiety disorder: Secondary | ICD-10-CM

## 2015-12-08 DIAGNOSIS — IMO0001 Reserved for inherently not codable concepts without codable children: Secondary | ICD-10-CM

## 2015-12-08 DIAGNOSIS — R030 Elevated blood-pressure reading, without diagnosis of hypertension: Secondary | ICD-10-CM

## 2015-12-08 MED ORDER — CLONAZEPAM 0.5 MG PO TABS: ORAL_TABLET | ORAL | Status: DC

## 2015-12-08 MED ORDER — OLMESARTAN MEDOXOMIL 5 MG PO TABS: 5.0000 mg | ORAL_TABLET | Freq: Every day | ORAL | Status: DC

## 2015-12-08 MED FILL — OLMESARTAN MEDOXOMIL 5 MG T: 5 | 15 days supply | Qty: 15 | Fill #0

## 2015-12-08 MED FILL — clonazePAM 0.5 MG TABS: 0.5 | 15 days supply | Qty: 30 | Fill #0

## 2015-12-08 NOTE — ED Provider Notes (Signed)
CSN: [id number]     Arrival date & time 12/08/15  [id number] History   First MD Initiated Contact with Patient 12/08/15 [phone]     Chief Complaint  Patient presents with  . Hypertension      HPI Comments: Patient reports that he quit smoking about 2 weeks ago.  He has become increasingly anxious and his BP has been elevated.  He is starting a job out of town in 3 days and is worried about that.  He does not feel down or depressed.   He notes that he has had similar symptoms in the past controlled by Klonopin, and his blood pressure responded to Benicar.  Patient is a 45 y.o. male presenting with anxiety. The history is provided by the patient.  Anxiety This is a new problem. Episode onset: 2 weeks ago. Episode frequency: intermittently. The problem has been gradually worsening. Pertinent negatives include no chest pain, no headaches and no shortness of breath. The symptoms are aggravated by stress. Nothing relieves the symptoms. He has tried nothing for the symptoms.    Past Medical History  Diagnosis Date  . Hyperlipidemia   . Hypertension   . Hyperlipidemia   . Anxiety   . Depression    Past Surgical History  Procedure Laterality Date  . Hernia repair    . High cholesterol     Family History  Problem Relation Age of Onset  . Hyperlipidemia Mother   . Hypertension Mother   . Alcohol abuse Father    Social History  Substance Use Topics  . Smoking status: Current Every Day Smoker -- 0.25 packs/day for 15 years    Types: Cigarettes  . Smokeless tobacco: Former Systems developer    Quit date: 10/11/2011  . Alcohol Use: No    Review of Systems  Respiratory: Negative for shortness of breath.   Cardiovascular: Negative for chest pain.  Neurological: Negative for headaches.    Allergies  Simvastatin  Home Medications   Prior to Admission medications   Medication Sig Start Date End Date Taking? Authorizing Provider  clonazePAM (KLONOPIN) 0.5 MG tablet Take one tab by mouth once or twice  daily as needed 12/08/15   Kandra Nicolas, MD  olmesartan (BENICAR) 5 MG tablet Take 1 tablet (5 mg total) by mouth daily. 12/08/15   Kandra Nicolas, MD  rosuvastatin (CRESTOR) 40 MG tablet Take 1 tablet (40 mg total) by mouth daily. 11/29/14   Silverio Decamp, MD   Meds Ordered and Administered this Visit  Medications - No data to display  BP 145/88 mmHg  Pulse 83  Temp(Src) 97.6 F (36.4 C) (Oral)  Ht 5\' 9"  (1.753 m)  Wt 204 lb (92.534 kg)  BMI 30.11 kg/m2  SpO2 100% No data found.   Physical Exam Nursing notes and Vital Signs reviewed. Appearance:  Patient appears stated age, and in no acute distress Psychiatric:  Patient is alert and oriented with good eye contact.  Thoughts are organized.  No psychomotor retardation.  Memory intact.  Not suicidal.  Euthymic mood and affect. Eyes:  Pupils are equal, round, and reactive to light and accomodation.  Extraocular movement is intact.  Conjunctivae are not inflamed  Ears:  Canals normal.  Tympanic membranes normal.  Nose:   Normal turbinates.   Pharynx:  Normal Neck:  Supple.  No thyromegaly or adenopathy  Lungs:  Clear to auscultation.  Breath sounds are equal.  Moving air well. Heart:  Regular rate and rhythm without murmurs, rubs, or gallops.  Abdomen:  Nontender without masses or hepatosplenomegaly.  Bowel sounds are present.  No CVA or flank tenderness.  Extremities:  No edema.  Skin:  No rash present.   ED Course  Procedures none  MDM   1. Blood pressure elevated   2. Anxiety state    Suspect nicotine withdrawal symptoms. Begin low dose Benicar, and Klonopin 0.5mg  once or twice daily. Monitor BP and record. Followup with Family Doctor in about 10 to 14 days.    Kandra Nicolas, MD 12/11/15 1213

## 2015-12-08 NOTE — ED Notes (Signed)
Elevated blood pressure, anxiety for about 2 weeks, he quit smoking 2 week ago and his bp has been elevated and he feels dizzy, anxious, panic. Starting a job out of town on Monday and is concerned.

## 2015-12-08 NOTE — Discharge Instructions (Signed)
Generalized Anxiety Disorder Generalized anxiety disorder (GAD) is a mental disorder. It interferes with life functions, including relationships, work, and school. GAD is different from normal anxiety, which everyone experiences at some point in their lives in response to specific life events and activities. Normal anxiety actually helps us prepare for and get through these life events and activities. Normal anxiety goes away after the event or activity is over.  GAD causes anxiety that is not necessarily related to specific events or activities. It also causes excess anxiety in proportion to specific events or activities. The anxiety associated with GAD is also difficult to control. GAD can vary from mild to severe. People with severe GAD can have intense waves of anxiety with physical symptoms (panic attacks).  SYMPTOMS The anxiety and worry associated with GAD are difficult to control. This anxiety and worry are related to many life events and activities and also occur more days than not for 6 months or longer. People with GAD also have three or more of the following symptoms (one or more in children):  Restlessness.   Fatigue.  Difficulty concentrating.   Irritability.  Muscle tension.  Difficulty sleeping or unsatisfying sleep. DIAGNOSIS GAD is diagnosed through an assessment by your health care provider. Your health care provider will ask you questions aboutyour mood,physical symptoms, and events in your life. Your health care provider may ask you about your medical history and use of alcohol or drugs, including prescription medicines. Your health care provider may also do a physical exam and blood tests. Certain medical conditions and the use of certain substances can cause symptoms similar to those associated with GAD. Your health care provider may refer you to a mental health specialist for further evaluation. TREATMENT The following therapies are usually used to treat GAD:    Medication. Antidepressant medication usually is prescribed for long-term daily control. Antianxiety medicines may be added in severe cases, especially when panic attacks occur.   Talk therapy (psychotherapy). Certain types of talk therapy can be helpful in treating GAD by providing support, education, and guidance. A form of talk therapy called cognitive behavioral therapy can teach you healthy ways to think about and react to daily life events and activities.  Stress managementtechniques. These include yoga, meditation, and exercise and can be very helpful when they are practiced regularly. A mental health specialist can help determine which treatment is best for you. Some people see improvement with one therapy. However, other people require a combination of therapies.   This information is not intended to replace advice given to you by your health care provider. Make sure you discuss any questions you have with your health care provider.   Document Released: 12/07/2012 Document Revised: 09/02/2014 Document Reviewed: 12/07/2012 Elsevier Interactive Patient Education 2016 Elsevier Inc.  

## 2015-12-24 ENCOUNTER — Ambulatory Visit (FREE_STANDING_LABORATORY_FACILITY): Payer: Self-pay | Admitting: Family

## 2015-12-24 ENCOUNTER — Encounter (INDEPENDENT_AMBULATORY_CARE_PROVIDER_SITE_OTHER): Payer: Self-pay

## 2015-12-24 VITALS — BP 117/82 | HR 87 | Temp 98.4°F | Resp 20 | Ht 69.0 in | Wt 200.0 lb

## 2015-12-24 DIAGNOSIS — R509 Fever, unspecified: Secondary | ICD-10-CM

## 2015-12-24 LAB — CBC AND DIFFERENTIAL
Basophils Absolute Automated: 0.04 10*3/uL (ref 0.00–0.20)
Basophils Automated: 1 %
Eosinophils Absolute Automated: 0.09 10*3/uL (ref 0.00–0.70)
Eosinophils Automated: 2 %
Hematocrit: 46.8 % (ref 42.0–52.0)
Hgb: 15.9 g/dL (ref 13.0–17.0)
Immature Granulocytes Absolute: 0.04 10*3/uL
Immature Granulocytes: 1 %
Lymphocytes Absolute Automated: 2.08 10*3/uL (ref 0.50–4.40)
Lymphocytes Automated: 35 %
MCH: 31.5 pg (ref 28.0–32.0)
MCHC: 34 g/dL (ref 32.0–36.0)
MCV: 92.9 fL (ref 80.0–100.0)
MPV: 10 fL (ref 9.4–12.3)
Monocytes Absolute Automated: 1.26 10*3/uL — ABNORMAL HIGH (ref 0.00–1.20)
Monocytes: 21 %
Neutrophils Absolute: 2.48 10*3/uL (ref 1.80–8.10)
Neutrophils: 41 %
Nucleated RBC: 0 /100 WBC (ref 0–1)
Platelets: 190 10*3/uL (ref 140–400)
RBC: 5.04 10*6/uL (ref 4.70–6.00)
RDW: 14 % (ref 12–15)
WBC: 5.99 10*3/uL (ref 3.50–10.80)

## 2015-12-24 NOTE — Progress Notes (Signed)
[facility]  PROGRESS NOTE     Patient: Lucas Molina   Date: 12/24/2015   MRN: [id number]       Lucas Molina is a 45 y.o. male      SUBJECTIVE     Chief Complaint   Patient presents with   . Fever     Pt presents for eval and tx of fever, chills, rash on bilateral shoulders x 3 d         HPI   Pt lives in [place] here for work  Fever started 2 days ago   bodyaches mucles aches  No uri symptoms   No recent travel   Rash appeared yesterday night on shoulders and back   No new detergent   No new soaps  Rash not painful or itchy   No known insects bites    Review of Systems   Constitutional: Positive for fatigue. Negative for fever, chills, activity change and appetite change.   HENT: Negative for congestion.    Respiratory: Negative for cough.    Musculoskeletal: Positive for myalgias.   All other systems reviewed and are negative.      The following portions of the patient's history were reviewed and updated as appropriate: Allergies, Current Medications, Past Family History, Past Medical history, Past social history, Past surgical history, and Problem List.    OBJECTIVE     Vitals   Filed Vitals:    12/24/15 1437   BP: 117/82   Pulse: 87   Temp: 98.4 F (36.9 C)   TempSrc: Oral   Resp: 20   Height: 1.753 m (5\' 9" )   Weight: 90.719 kg (200 lb)   SpO2: 99%       Physical Exam   Nursing note and vitals reviewed.  Constitutional: He is oriented to person, place, and time. Vital signs are normal. He appears well-developed and well-nourished. He is active and cooperative.  Non-toxic appearance. He does not have a sickly appearance. He does not appear ill. No distress.   HENT:   Head: Normocephalic and atraumatic.   Right Ear: Tympanic membrane normal.   Left Ear: Tympanic membrane normal.   Nose: Nose normal. No mucosal edema or rhinorrhea.   Mouth/Throat: Oropharynx is clear and moist and mucous membranes are normal. No oropharyngeal exudate, posterior oropharyngeal edema, posterior oropharyngeal erythema  or tonsillar abscesses.   Eyes: Conjunctivae and EOM are normal. Pupils are equal, round, and reactive to light. No scleral icterus.   Neck: Normal range of motion. Neck supple. Carotid bruit is not present. No thyroid mass and no thyromegaly present.   Cardiovascular: Normal rate, regular rhythm, normal heart sounds, intact distal pulses and normal pulses.    No murmur heard.  Pulmonary/Chest: Effort normal and breath sounds normal. No accessory muscle usage. No tachypnea. No respiratory distress. He has no decreased breath sounds. He has no wheezes. He has no rhonchi. He has no rales.   Abdominal: Soft. Normal appearance and bowel sounds are normal. He exhibits no distension and no mass. There is no tenderness.   Musculoskeletal: Normal range of motion.   Lymphadenopathy:     He has no cervical adenopathy.   Neurological: He is alert and oriented to person, place, and time. He has normal strength and normal reflexes. No cranial nerve deficit or sensory deficit. He displays a negative Romberg sign. Gait normal.   Skin: Skin is warm, dry and intact. No rash noted.   Papular rash with erythematous base  No vesicles  No papules   No tenderness   No warmth      Psychiatric: He has a normal mood and affect. His speech is normal and behavior is normal.       Lab Results (24 Hour)   Results     ** No results found for the last 24 hours. **          Radiology Results (24 Hour)     ** No results found for the last 24 hours. **          ASSESSMENT     Encounter Diagnosis   Name Primary?   . Fever, unspecified fever cause Yes   tylenol or ibuprofen for fever  Cbc in am   If pt develops worsening aches, neck pain, visual changes, change in mental status  Go to ed   Otherwise will f/u in am after cbc   DDX:  Viral exanthem, Bacterial Skin infection , Insect Bite,        PLAN     Procedures    1. Fever, unspecified fever cause  - CBC and differential      An After Visit Summary was printed and given to the  patient.      Signed,  Radene Gunning, NP  12/24/2015

## 2015-12-24 NOTE — Patient Instructions (Signed)
Febrile Illness, Uncertain Cause (Adult)  You have a fever, but the cause is not certain. A fever is a natural reaction of the body to an illness such as infection due to a virus or bacteria. In most cases, the temperature itself is not harmful. It actually helps the body fight infections. A fever does not need to be treated unless you feel very uncomfortable.  Sometimes a fever can be an early sign of a more serious infection, so make sure to follow up if your condition worsens.  Home care  Unless given other instructions by your healthcare provider, follow these guidelines when caring for yourself at home.  General care   If your symptoms are severe, rest at home for the first 2 to 3 days. When you resume activity, don't let yourself get too tired.   Do not smoke. Also avoid being exposed to secondhand smoke.   Your appetite may be poor, so a light diet is fine. Avoid dehydration by drinking 6 to 8 glasses of fluids per day (such as water, soft drinks, sports drinks, juices, tea, or soup). Extra fluids will help loosen secretions in the nose and lungs.  Medicines   You can take acetaminophen or ibuprofen for pain, unless you were given a different fever-reducing/pain medicine to use. (Note: If you have chronic liver or kidney disease or have ever had a stomach ulcer or gastrointestinal bleeding, talk with your healthcare provider before using these medicines. Also talk to your provider if you are taking medicine to prevent blood clots.) Aspirin should never be given to anyone younger than 46 years of age who is ill with a viral infection or fever. It may cause severe liver or brain damage.   If you were given antibiotics, take them until they are used up, or your healthcare provider tells you to stop. It is important to finish the antibiotics even though you feel better. This is to make sure the infection has cleared. Be aware that antibiotics are not usually given for a fever with an unknown  cause.   Over-the-counter medicines will not shorten the duration of the illness. However, they may be helpful for the following symptoms: cough, sore throat, or nasal and sinus congestion. Ask your pharmacist for product suggestions. (Note: Do not use decongestants if you have high blood pressure.)  Follow-up care  Follow up with your healthcare provider, or as advised.   If a culture was done, you will be notified if your treatment needs to be changed. You can call as directed for the results.   If X-rays, a CT, or an ultrasound were done, a specialist will review them. You will be notified of any findings that may affect your care.  Call 911  Contact emergency services right away if any of these occur:   Trouble breathing or swallowing, or wheezing   Chest pain   Confusion   Extreme drowsiness or trouble awakening   Fainting or loss of consciousness   Rapid heart rate   Low blood pressure   Vomiting blood, or large amounts of blood in stool   Seizure  When to seek medical advice  Call your healthcare provider right away if any of these occur:   Cough with lots of colored sputum (mucus) or blood in your sputum   Severe headache   Face, neck, throat, or ear pain   Feeling drowsy   Abdominal pain   Repeated vomiting or diarrhea   Joint pain or a new rash     Burning when urinating   Fever of 100.4F (38C) or higher, that does not get better after taking fever-reducing medicine   Feeling weak or dizzy  Date Last Reviewed: 03/24/2014   2000-2016 [employer] [address], [place] [address]. All rights reserved. This information is not intended as a substitute for professional medical care. Always follow your healthcare professional's instructions.

## 2015-12-26 ENCOUNTER — Telehealth (INDEPENDENT_AMBULATORY_CARE_PROVIDER_SITE_OTHER): Payer: Self-pay

## 2015-12-26 NOTE — Telephone Encounter (Signed)
Results given.

## 2015-12-26 NOTE — Telephone Encounter (Signed)
Left message to call for results

## 2016-01-31 ENCOUNTER — Ambulatory Visit (INDEPENDENT_AMBULATORY_CARE_PROVIDER_SITE_OTHER): Payer: Self-pay | Admitting: Internal Medicine

## 2016-05-24 ENCOUNTER — Ambulatory Visit (INDEPENDENT_AMBULATORY_CARE_PROVIDER_SITE_OTHER): Payer: BLUE CROSS/BLUE SHIELD | Admitting: Sports Medicine

## 2016-05-24 ENCOUNTER — Encounter: Payer: Self-pay | Admitting: Sports Medicine

## 2016-05-24 DIAGNOSIS — M722 Plantar fascial fibromatosis: Secondary | ICD-10-CM

## 2016-05-24 DIAGNOSIS — M25511 Pain in right shoulder: Secondary | ICD-10-CM | POA: Diagnosis not present

## 2016-05-24 NOTE — Assessment & Plan Note (Signed)
Custom orthotics as above. 

## 2016-05-24 NOTE — Progress Notes (Signed)

## 2016-05-24 NOTE — Assessment & Plan Note (Signed)
Fairly benign exam with the exception of a positive O'Brien's test. Formal physical therapy, over-the-counter NSAIDs. If insufficient improved but we will proceed with MRI arthrogram. He does have a negative x-ray at an outside facility.

## 2016-08-22 ENCOUNTER — Telehealth: Payer: Self-pay

## 2016-08-22 ENCOUNTER — Institutional Professional Consult (permissible substitution): Payer: BLUE CROSS/BLUE SHIELD | Admitting: Neurology

## 2016-08-22 NOTE — Telephone Encounter (Signed)
Pt did not show for their appt with Dr. Athar today.  

## 2016-08-23 ENCOUNTER — Encounter: Payer: Self-pay | Admitting: Neurology

## 2016-08-30 ENCOUNTER — Other Ambulatory Visit: Payer: Self-pay

## 2016-08-30 DIAGNOSIS — E7849 Other hyperlipidemia: Secondary | ICD-10-CM

## 2016-08-30 MED ORDER — ROSUVASTATIN CALCIUM 40 MG PO TABS: 40.0000 mg | ORAL_TABLET | Freq: Every day | ORAL | 0 refills | Status: DC

## 2016-08-30 MED FILL — ROSUVASTATIN CALCIUM 40 MG: 40 | 30 days supply | Qty: 30 | Fill #0

## 2016-09-09 ENCOUNTER — Ambulatory Visit (INDEPENDENT_AMBULATORY_CARE_PROVIDER_SITE_OTHER): Payer: BLUE CROSS/BLUE SHIELD | Admitting: Sports Medicine

## 2016-09-09 ENCOUNTER — Encounter: Payer: Self-pay | Admitting: Sports Medicine

## 2016-09-09 DIAGNOSIS — M25511 Pain in right shoulder: Secondary | ICD-10-CM

## 2016-09-09 DIAGNOSIS — E784 Other hyperlipidemia: Secondary | ICD-10-CM | POA: Diagnosis not present

## 2016-09-09 DIAGNOSIS — G8929 Other chronic pain: Secondary | ICD-10-CM

## 2016-09-09 DIAGNOSIS — J3089 Other allergic rhinitis: Secondary | ICD-10-CM

## 2016-09-09 DIAGNOSIS — Z0000 Encounter for general adult medical examination without abnormal findings: Secondary | ICD-10-CM | POA: Diagnosis not present

## 2016-09-09 DIAGNOSIS — E7800 Pure hypercholesterolemia, unspecified: Secondary | ICD-10-CM

## 2016-09-09 DIAGNOSIS — Z0001 Encounter for general adult medical examination with abnormal findings: Secondary | ICD-10-CM

## 2016-09-09 DIAGNOSIS — E7849 Other hyperlipidemia: Secondary | ICD-10-CM

## 2016-09-09 MED ORDER — FLUTICASONE PROPIONATE 50 MCG/ACT NA SUSP: NASAL | 3 refills | Status: DC

## 2016-09-09 MED ORDER — OLMESARTAN MEDOXOMIL 5 MG PO TABS: 5.0000 mg | ORAL_TABLET | Freq: Every day | ORAL | 3 refills | Status: DC

## 2016-09-09 MED ORDER — CLONAZEPAM 0.5 MG PO TABS: ORAL_TABLET | ORAL | 0 refills | Status: DC

## 2016-09-09 MED ORDER — ROSUVASTATIN CALCIUM 40 MG PO TABS: 40.0000 mg | ORAL_TABLET | Freq: Every day | ORAL | 3 refills | Status: DC

## 2016-09-09 MED FILL — FLUTICASONE PROP 50 MCG SPR: 50 | 90 days supply | Qty: 48 | Fill #0

## 2016-09-09 NOTE — Assessment & Plan Note (Signed)
Rechecking lipids. 

## 2016-09-09 NOTE — Progress Notes (Signed)
Subjective:    CC: Annual physical exam  HPI:  Seth Waters is here for his physical exam, he has several complaints.  Right shoulder pain: Present for years, worse over the deltoid with overhead activities, moderate, persistent, he has done months of physical therapy in the past and he does have a negative x-ray from an outside facility. Pain is worse with overhead activities, no mechanical symptoms.  Runny nose: Present year-round, wherever he goes, mild itching, congestion. Hasn't really tried any medications for this.  Hyperlipidemia: Needs a refill on medication  Anxiety: Needs a refill, 30 pills of clonazepam lasted him almost a year.  Past medical history:  Negative.  See flowsheet/record as well for more information.  Surgical history: Negative.  See flowsheet/record as well for more information.  Family history: Negative.  See flowsheet/record as well for more information.  Social history: Negative.  See flowsheet/record as well for more information.  Allergies, and medications have been entered into the medical record, reviewed, and no changes needed.    Review of Systems: No headache, visual changes, nausea, vomiting, diarrhea, constipation, dizziness, abdominal pain, skin rash, fevers, chills, night sweats, swollen lymph nodes, weight loss, chest pain, body aches, joint swelling, muscle aches, shortness of breath, mood changes, visual or auditory hallucinations.  Objective:    General: Well Developed, well nourished, and in no acute distress.  Neuro: Alert and oriented x3, extra-ocular muscles intact, sensation grossly intact. Cranial nerves II through XII are intact, motor, sensory, and coordinative functions are all intact. HEENT: Normocephalic, atraumatic, pupils equal round reactive to light, neck supple, no masses, no lymphadenopathy, thyroid nonpalpable. Oropharynx, nasopharynx, external ear canals are unremarkable. Skin: Warm and dry, no rashes noted.  Cardiac: Regular  rate and rhythm, no murmurs rubs or gallops.  Respiratory: Clear to auscultation bilaterally. Not using accessory muscles, speaking in full sentences.  Abdominal: Soft, nontender, nondistended, positive bowel sounds, no masses, no organomegaly.  Right Shoulder: Inspection reveals no abnormalities, atrophy or asymmetry. Palpation is normal with no tenderness over AC joint or bicipital groove. ROM is full in all planes. Rotator cuff strength normal throughout. Positive Neer and Hawkin's tests, empty can. Speeds and Yergason's tests normal. No labral pathology noted with negative Obrien's, negative crank, negative clunk, and good stability. Normal scapular function observed. No painful arc and no drop arm sign. No apprehension sign  Procedure: Real-time Ultrasound Guided Injection of right subacromial bursa Device: GE Logiq E  Verbal informed consent obtained.  Time-out conducted.  Noted no overlying erythema, induration, or other signs of local infection.  Skin prepped in a sterile fashion.  Local anesthesia: Topical Ethyl chloride.  With sterile technique and under real time ultrasound guidance:  Noted a mildly distended subcutaneous bursa. 1 mL kenalog 40, 1 mL lidocaine, 1 mL Marcaine injected easily Completed without difficulty  Pain immediately resolved suggesting accurate placement of the medication.  Advised to call if fevers/chills, erythema, induration, drainage, or persistent bleeding.  Images permanently stored and available for review in the ultrasound unit.  Impression: Technically successful ultrasound guided injection.  Impression and Recommendations:    The patient was counselled, risk factors were discussed, anticipatory guidance given.  Right shoulder pain More rotator cuff signs today. Subacromial injection as above. If insufficient response we will proceed with MRI arthrogram, he does have a negative x-ray from an outside facility and has failed greater than 6  weeks of physician directed rehabilitation.  Annual physical exam Unremarkable physical except as noted above. Ordering blood work.  Hyperlipidemia  Rechecking lipids.  Perennial allergic rhinitis Adding Flonase daily

## 2016-09-09 NOTE — Assessment & Plan Note (Signed)
Adding Flonase daily

## 2016-09-09 NOTE — Assessment & Plan Note (Signed)
Unremarkable physical except as noted above. Ordering blood work.

## 2016-09-09 NOTE — Assessment & Plan Note (Signed)
More rotator cuff signs today. Subacromial injection as above. If insufficient response we will proceed with MRI arthrogram, he does have a negative x-ray from an outside facility and has failed greater than 6 weeks of physician directed rehabilitation.

## 2016-09-10 LAB — LIPID PANEL
Cholesterol: 180 mg/dL (ref <200)
HDL: 45 mg/dL (ref >40)
LDL Cholesterol: 118 mg/dL — ABNORMAL HIGH (ref <100)
Total CHOL/HDL Ratio: 4 Ratio (ref <5.0)
Triglycerides: 83 mg/dL (ref <150)
VLDL: 17 mg/dL (ref <30)

## 2016-09-10 LAB — COMPREHENSIVE METABOLIC PANEL
ALT: 46 U/L (ref 9–46)
AST: 36 U/L (ref 10–40)
Albumin: 4.3 g/dL (ref 3.6–5.1)
Alkaline Phosphatase: 66 U/L (ref 40–115)
BUN: 19 mg/dL (ref 7–25)
CO2: 26 mmol/L (ref 20–31)
Calcium: 9.4 mg/dL (ref 8.6–10.3)
Chloride: 105 mmol/L (ref 98–110)
Glucose, Bld: 84 mg/dL (ref 65–99)
Sodium: 139 mmol/L (ref 135–146)
Total Protein: 7.1 g/dL (ref 6.1–8.1)

## 2016-09-10 LAB — CBC
HCT: 46.6 % (ref 38.5–50.0)
Hemoglobin: 15.9 g/dL (ref 13.2–17.1)
MCH: 31.2 pg (ref 27.0–33.0)
MCHC: 34.1 g/dL (ref 32.0–36.0)
MCV: 91.4 fL (ref 80.0–100.0)
MPV: 9.9 fL (ref 7.5–12.5)
Platelets: 241 10*3/uL (ref 140–400)
RBC: 5.1 MIL/uL (ref 4.20–5.80)
RDW: 13 % (ref 11.0–15.0)
WBC: 7.9 10*3/uL (ref 3.8–10.8)

## 2016-09-10 LAB — HEMOGLOBIN A1C
Hgb A1c MFr Bld: 5.3 % (ref <5.7)
Mean Plasma Glucose: 105 mg/dL

## 2016-09-10 LAB — TSH: TSH: 1.58 mIU/L (ref 0.40–4.50)

## 2016-09-10 LAB — COMPREHENSIVE METABOLIC PANEL WITH GFR
Creat: 0.97 mg/dL (ref 0.60–1.35)
Potassium: 4.4 mmol/L (ref 3.5–5.3)
Total Bilirubin: 0.6 mg/dL (ref 0.2–1.2)

## 2016-09-10 LAB — VITAMIN D 25 HYDROXY (VIT D DEFICIENCY, FRACTURES): Vit D, 25-Hydroxy: 30 ng/mL (ref 30–100)

## 2016-09-13 MED FILL — clonazePAM 0.5 MG TABS: 0.5 | 15 days supply | Qty: 30 | Fill #0

## 2016-10-04 ENCOUNTER — Ambulatory Visit: Payer: Self-pay | Admitting: Sports Medicine

## 2016-10-07 MED FILL — ROSUVASTATIN CALCIUM 40 MG: 40 | 90 days supply | Qty: 90 | Fill #0

## 2016-12-11 ENCOUNTER — Other Ambulatory Visit (INDEPENDENT_AMBULATORY_CARE_PROVIDER_SITE_OTHER): Payer: Self-pay

## 2016-12-11 ENCOUNTER — Ambulatory Visit (INDEPENDENT_AMBULATORY_CARE_PROVIDER_SITE_OTHER): Payer: BLUE CROSS/BLUE SHIELD | Admitting: Family

## 2016-12-11 ENCOUNTER — Institutional Professional Consult (permissible substitution) (INDEPENDENT_AMBULATORY_CARE_PROVIDER_SITE_OTHER): Payer: BLUE CROSS/BLUE SHIELD | Admitting: Family

## 2016-12-11 ENCOUNTER — Encounter (INDEPENDENT_AMBULATORY_CARE_PROVIDER_SITE_OTHER): Payer: Self-pay | Admitting: Family

## 2016-12-11 VITALS — BP 118/81 | HR 85 | Temp 98.1°F | Resp 16 | Ht 69.0 in | Wt 207.0 lb

## 2016-12-11 DIAGNOSIS — R0683 Snoring: Secondary | ICD-10-CM | POA: Insufficient documentation

## 2016-12-11 DIAGNOSIS — E7849 Other hyperlipidemia: Secondary | ICD-10-CM | POA: Insufficient documentation

## 2016-12-11 DIAGNOSIS — R41840 Attention and concentration deficit: Secondary | ICD-10-CM | POA: Insufficient documentation

## 2016-12-11 DIAGNOSIS — Z1339 Encounter for screening examination for other mental health and behavioral disorders: Secondary | ICD-10-CM

## 2016-12-11 DIAGNOSIS — Z1389 Encounter for screening for other disorder: Secondary | ICD-10-CM

## 2016-12-11 DIAGNOSIS — Z130 Encounter for screening for diseases of the blood and blood-forming organs and certain disorders involving the immune mechanism: Secondary | ICD-10-CM

## 2016-12-11 DIAGNOSIS — Z6830 Body mass index (BMI) 30.0-30.9, adult: Secondary | ICD-10-CM

## 2016-12-11 LAB — FERRITIN: Ferritin: 98 ng/mL (ref 20–230)

## 2016-12-11 NOTE — Progress Notes (Signed)
CC: ADHD Evaluation - yes  New or Established? NEW. Moved here from [place] recently (last six months)  He found a visit summary from his physician in [place]. Oden who completed   Accompanied by:NO  Referred by: by a doctor   PCP: ?  Packet complete:today  Last Physical: 7 months ago  Working at [employer] until end of the year.   Previously Diagnosed: NONE    Most bothersome symptoms: 1.5 yrs ago he started experiencing more and brain fog.  Feels dull, cant think straight. Feels like he is not a sharp.  Occurs in several settings even at home and at work. Sometimes even filling out forms will be hard for him. He sometimes mixes the year. He is distracted.   Duration of these symptoms: since age 46  Current Social impact of symptoms:     Work/Education: He has done well at his job. An employer had ask him about ADHD about 1.5 yrs ago. This was  In [place]. He quit smoking cigerettes and he feels better off the cigerettes.     Relationship: no marriages, occasional relationships    Other: none    Relevant Family HX of ADHD or other psychiatric concerns? No one officially diagnosed   Psych: Father can't sit still or watch an entire movie.  He did jump around a lot.     Mother is healthy    Brother younger - he had some speech delay   Neuro: none    Review of System:  Constitution:    Sleep problems: + snore.  No sleep study. Refreshed in the morning 2 days per weeks   Eating disorders or nutritional conditions?  He has changed  His diet.  He is over weight.  Psych:    Anxiety:  Denies hx   Depression: told he had depression. He has taken Paxil and felt numb. Wellbutrin helped some.  He did not respond well to this either.    Bipolar: denies hx    PTSD: denies hx   Stress: settled down since moving to [place]   Other: none  Recreational drugs or alcohol:   Current: none    Past: none  HEENT:   Vision Negative   Hearing negative     Endo: Fatigue: Most of the time he is not tired.    Thyroid conditions: No  history of thyroid problem, TSH was 2016 (1.135)    Diabetes: none    Neuro:   Concussions: Hit with baseball a few times.    Seizures: none   Other neurologic problems   Hx of Learning Disabilities:  None   Cardio:  - he takes medication for cholesterol  Respiratory:  Allergies:  Heme:  Including transfusions   Hx of anemia or low ferritin  [id number]  GU:  none   STD   HIV  Integumentary: none  MSK/Rheuma:  none  Immun:none: none  Toxic Exposures:none    ADHD Specific HX:    Significant birth history:  negative  Health of Mother: negative  Infant Trauma/head injury/Ilness:negative    Toddler/Preschool:  Developmental Delays: none  Significant health concerns: none  Health of Family:none     Kindergarten/Elementary School:  Academic Concerns: first few years he did well with help from parents; 4th grade on he had more problems.  He was always needing more reminding. He needed special help and past his classes  Social Concerns: baseball  Family or Home Life Concerns: Arguing   Significant health concerns:  Junior High or Highschool:   Academic Concerns: He dropped out of high school. Skipped school.  He was often bored, he just could not attend to it. Cared less about it.   Social Concerns: He did get trouble with pot and alcohol.  He went back to earn GED. Played baseball but not in school.    Family or Home Life Concerns: Father was gone more then, out of town working.     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   Social Concerns: He did some environment testing.   Family or Home Life Concerns:   Significant health concerns: not now    Health Maintenance reviewed   MA notes reviewed.    Objective     Vitals:    12/11/16 1650   BP: 118/81   BP Cuff Size: Regular   BP Site: Left Arm   BP Position: Sitting   Pulse: 85   Resp: 16   Temp: 98.1 F (36.7 C)   TempSrc: Temporal   SpO2: 95%   Weight: 207 lb (93.9 kg)   Height:  (1.753 m)     Constitution:   General appearance: casually  dressed  HEENT:  Cardiac:  Behavior/Activity: calm and cooperative  Speech: clear, coherent, fluent.  Affect: normal affect  Mood: normal mood.    Thought form: normal: logical, coherent, goal-directed.  Thought content: normal  Attention/concentration: alert   Memory: age appropriate  Insight/Judgment: good insight and judgment  Neuro:    ASRS: Part A: * 6/6  Part B: *5/12  GAD7 = 5  Phq9 = 9  MDQ= Negative  WENDER:=Positive greater than 46  Epworth=12    Concern of risk for Stimulant Prescription:    ASSESSMENT AND PLAN:    At this visit -Does the patient sufficiently initially meet the APA DSM-V Criteria for Adult ADHD: No, he has self report strong history but I do not have any verification of these symptom.   Positive Wender and ASRS.    He does snore and the brain fog he described may be low testosterone vs sleep disturbance. I have encouraged him to first have the sleep study completed. Epworth scores is 12 which is significant..     Confirmation plan with outside adult: He agreed to speak with his MOTHER. They are on a cruise right now for several weeks.     Records of prior Diagnosis Present:none    Concerns/Co morbidities identified: Sleep disorder/apnea?      Recommendations and Interventions:  Continue current medication:  Not taking meds  Prescribe ADHD medication at future visit: tbd  Additional testing for ADHD: yes MOCA, trail making, conner  Treat within Primary care: not available  Refer to Psych: no  Refer for Neuro:no  Refer for Sleep study:yes done  Contract Letter:no  Urine Drug screen ordered:yes done  Counseling/Coaching: possibly, not done  Nutrition:to be discusse  Lifestyle changes:   Sleep tbd   Exercise swimming now

## 2016-12-11 NOTE — Patient Instructions (Addendum)
It was a pleasure to see you in clinic today.               Lab  Sleep study  Follow up with Ecare     If you are not yet signed up for eCare, please see the below eCare section at the end of this document for how to enroll and to get your access codes.  It's easy to sign up at home today.    eCare  enrollment will allow you access to the below benefits   You can make appointments online   View test results / Lab Results   Request prescription renewals   Obtain a copy of our After Visit Summary (an electronic copy of this document)     Your Test Results:  If labs were ordered today the results are expected to be available via eCare in about 5 days. If you have an active eCare account, this is how we will notify you of your results.     If you do not have an eCare account then your test results will be mailed to you within about 14 days after your tests are completed. If your physician needs to change your care based on your results or is concerned, you should expect a phone call or eCare message from your provider.    If you have any questions about your test results please schedule an appointment with your provider, so that we may review them with you in greater detail.    **If it has been more than 2 weeks and you have not received your test results please send our office a message via eCare.    Medication Refills: If you need a prescription refilled, please contact your pharmacy 1 week before your current supply will run out to request the refill.  Contacting your pharmacy is the fastest and safest way to obtain a medication refill.  The pharmacy will notify our office.  Please note, that a minimum of 48 to 72 hours is needed to refill a medication,  Please call your pharmacy early to allow enough time to refill before you anticipate running out.  For faster medication refills, you can also schedule an appointment with your provider.    We know you have a choice in where you receive your healthcare and we  sincerely thank you for trusting [facility] with your health.

## 2016-12-11 NOTE — Progress Notes (Signed)
Chronic Pain Urine Temperature:       Urine Temperature was 98.0

## 2016-12-11 NOTE — Progress Notes (Signed)
Reviewed eCare status with Patient:  YES    HEALTH MAINTENANCE:  Has the patient had any of these since their last visit?    Cervical screening/PAP: Not Due     Mammo: Not Due    Colon Screen: Not Due    Diabetic Eye Exam: Not Due    Have you seen a specialist since your last visit: No    HM Due:   Health Maintenance   Topic Date Due    Depression Screening (PHQ-2)  09/27/1981    HIV Screening  09/27/1984    Tetanus Vaccine  09/27/1988    Lipid Disorders Screening  09/27/2004    Influenza Vaccine (Season Ended) 05/26/2017     No future appointments.

## 2016-12-12 LAB — CHR PAIN DRUG RSK1 W/CNSLT, URN
Amphet/Methamphetamine Qual,URN: NEGATIVE
Barbiturate (Qual), URN: NEGATIVE
Benzodiazepines (Qual), URN: NEGATIVE
Buprenorphine Qualitatiave, URN: NEGATIVE
Cannabinoids (Qual), URN: NEGATIVE
Cocaine (Qual), URN: NEGATIVE
Methadone (Qual), URN: NEGATIVE
Opiates (Qual), URN: NEGATIVE
Oxycodone (Qual), URN: NEGATIVE

## 2016-12-13 ENCOUNTER — Telehealth (INDEPENDENT_AMBULATORY_CARE_PROVIDER_SITE_OTHER): Payer: Self-pay | Admitting: Family

## 2016-12-13 NOTE — Telephone Encounter (Signed)
(  TEXTING IS AN OPTION FOR [facility] CLINICS ONLY)  Is this a [facility] clinic? Yes. What is the mobile number we can use to get a hold of you via text? [phone]      RETURN CALL: Detailed message on voicemail only      SUBJECT:  Results Request     TEST ORDERED BY: Fonnie Mu  TYPE OF TEST? Urine  LOCATION OF TEST: [facility] [place]  DATE OF TEST: 12/11/16  ADDITIONAL INFORMATION: Patient would like a call back to discuss,thank you.

## 2016-12-17 NOTE — Telephone Encounter (Signed)
Sent a message by eCare because I had laryngitis

## 2016-12-23 ENCOUNTER — Ambulatory Visit (INDEPENDENT_AMBULATORY_CARE_PROVIDER_SITE_OTHER): Payer: BLUE CROSS/BLUE SHIELD | Admitting: Family

## 2016-12-23 ENCOUNTER — Encounter (INDEPENDENT_AMBULATORY_CARE_PROVIDER_SITE_OTHER): Payer: Self-pay | Admitting: Family

## 2016-12-23 VITALS — BP 122/82 | HR 55 | Temp 98.2°F | Resp 14 | Ht 69.0 in | Wt 207.0 lb

## 2016-12-23 DIAGNOSIS — G479 Sleep disorder, unspecified: Secondary | ICD-10-CM

## 2016-12-23 DIAGNOSIS — Z6830 Body mass index (BMI) 30.0-30.9, adult: Secondary | ICD-10-CM

## 2016-12-23 DIAGNOSIS — R4189 Other symptoms and signs involving cognitive functions and awareness: Secondary | ICD-10-CM

## 2016-12-23 MED ORDER — BUPROPION HCL 100 MG OR TABS
100.0000 mg | ORAL_TABLET | Freq: Three times a day (TID) | ORAL | 2 refills | Status: AC
Start: 2016-12-23 — End: ?

## 2016-12-23 MED ORDER — CLONIDINE HCL 0.1 MG OR TABS
ORAL_TABLET | ORAL | 3 refills | Status: AC
Start: 2016-12-23 — End: ?

## 2016-12-23 NOTE — Patient Instructions (Addendum)
It was a pleasure to see you in clinic today.               [facility]  Dr.  Gus Rankin        If you are not yet signed up for eCare, please see the below eCare section at the end of this document for how to enroll and to get your access codes.  It's easy to sign up at home today.    eCare  enrollment will allow you access to the below benefits   You can make appointments online   View test results / Lab Results   Request prescription renewals   Obtain a copy of our After Visit Summary (an electronic copy of this document)     Your Test Results:  If labs were ordered today the results are expected to be available via eCare in about 5 days. If you have an active eCare account, this is how we will notify you of your results.     If you do not have an eCare account then your test results will be mailed to you within about 14 days after your tests are completed. If your physician needs to change your care based on your results or is concerned, you should expect a phone call or eCare message from your provider.    If you have any questions about your test results please schedule an appointment with your provider, so that we may review them with you in greater detail.    **If it has been more than 2 weeks and you have not received your test results please send our office a message via eCare.    Medication Refills: If you need a prescription refilled, please contact your pharmacy 1 week before your current supply will run out to request the refill.  Contacting your pharmacy is the fastest and safest way to obtain a medication refill.  The pharmacy will notify our office.  Please note, that a minimum of 48 to 72 hours is needed to refill a medication,  Please call your pharmacy early to allow enough time to refill before you anticipate running out.  For faster medication refills, you can also schedule an appointment with your provider.    We know you have a choice in where you receive your healthcare and we  sincerely thank you for trusting [facility] with your health.

## 2016-12-23 NOTE — Progress Notes (Signed)
Reviewed eCare status with Patient:  YES    HEALTH MAINTENANCE:  Has the patient had any of these since their last visit?    Cervical screening/PAP: Not Due     Mammo: Not Due    Colon Screen: Not Due    Diabetic Eye Exam: Not Due    Have you seen a specialist since your last visit: No    HM Due:   Health Maintenance   Topic Date Due    Depression Screening (PHQ-2)  09/27/1981    HIV Screening  09/27/1984    Tetanus Vaccine  09/27/1988    Lipid Disorders Screening  09/27/2004    Diabetes Screening  09/27/2009    Influenza Vaccine (Season Ended) 05/26/2017     No future appointments.

## 2016-12-23 NOTE — Progress Notes (Signed)
CHIEF Complaint(s)  Brent Riley is a 46 year old Male here with chief complaint(s):   He is here for follow up. He does not have his records. He has not been able to schedule his sleep study.   The insurance costs are too high due to his deductible.  Again he has not prior records. He is not comfortable speaking with his mother.    He needs to establish care with PCP for health maintenance.    Allergies-Simvastatin    Review of Systems:  Constitution: vitals signs stable, smiling, no distress, weight   Psych: He denies any depression but he does have the anxiety. He does tend  worry.   He has hx of depression in the past. He has been on bupropion for smoking cessation as well as paxil which was helpful to him.      No past medical history on file.  No past surgical history on file.  family history is not on file.  No past surgical history on file.  Social History     Social History    Marital status: N/A     Spouse name: N/A    Number of children: N/A    Years of education: N/A     Occupational History    Not on file.     Social History Main Topics    Smoking status: Never Smoker    Smokeless tobacco: Not on file    Alcohol use No    Drug use: Unknown    Sexual activity: Not on file     Other Topics Concern    Not on file     Social History Narrative    No narrative on file       Patient Active Problem List   Diagnosis    Cognitive attention deficit    Primary snoring    Other hyperlipidemia     Current Outpatient Prescriptions   Medication Sig Dispense Refill    Rosuvastatin Calcium (CRESTOR OR)        No current facility-administered medications for this visit.          OBJECTIVE:  Constitution: patient appears comfortable and engaged in conversation  General appearance: casually dressed  Behavior/Activity: calm and cooperative  Speech: clear, coherent, fluent.  Affect: normal affect  Mood: normal mood.    Thought form: normal: logical, coherent, goal-directed.  Thought content:  normal  Attention/concentration: alert   Memory: age appropriate  Insight/Judgment: good insight and judgment          Health Maintenance reviewed - reviewed, reminded and ordered.    We discus    ASSESSMENT AND PLAN    (R41.89) Cognitive deficits  (primary encounter diagnosis)  Plan: BuPROPion HCl 100 MG Oral Tab  WE discussed importance of staying away from stimulants and importance of sleep and sleep study.  He will also cut out the simple sugars and carbs.  Exercise daily    (G47.9) Sleep disorder  Plan: CloNIDine HCl 0.1 MG Oral Tab  Encouraged sleep study     He will start with bupropion and add the clonidine if needed or switch.  Gave him additional outside resource information as well.           Orders per documented in this encounter  Patient instructions per documented in this encounter          Patient Instructions     It was a pleasure to see you in clinic today.  If you are not yet signed up for eCare, please see the below eCare section at the end of this document for how to enroll and to get your access codes.  It's easy to sign up at home today.    eCare  enrollment will allow you access to the below benefits   You can make appointments online   View test results / Lab Results   Request prescription renewals   Obtain a copy of our After Visit Summary (an electronic copy of this document)     Your Test Results:  If labs were ordered today the results are expected to be available via eCare in about 5 days. If you have an active eCare account, this is how we will notify you of your results.     If you do not have an eCare account then your test results will be mailed to you within about 14 days after your tests are completed. If your physician needs to change your care based on your results or is concerned, you should expect a phone call or eCare message from your provider.    If you have any questions about your test results please schedule an appointment with your provider, so that we  may review them with you in greater detail.    **If it has been more than 2 weeks and you have not received your test results please send our office a message via [place].    Medication Refills: If you need a prescription refilled, please contact your pharmacy 1 week before your current supply will run out to request the refill.  Contacting your pharmacy is the fastest and safest way to obtain a medication refill.  The pharmacy will notify our office.  Please note, that a minimum of 48 to 72 hours is needed to refill a medication,  Please call your pharmacy early to allow enough time to refill before you anticipate running out.  For faster medication refills, you can also schedule an appointment with your provider.    We know you have a choice in where you receive your healthcare and we sincerely thank you for trusting [facility] with your health.

## 2017-01-24 ENCOUNTER — Telehealth: Payer: Self-pay

## 2017-01-24 MED ORDER — CLONAZEPAM 0.5 MG PO TABS: ORAL_TABLET | ORAL | 0 refills | Status: DC

## 2017-01-24 NOTE — Telephone Encounter (Signed)
rx in box 

## 2017-01-24 NOTE — Telephone Encounter (Signed)
Seth Waters would like a refill on clonazepam. Please advise.

## 2017-01-27 ENCOUNTER — Telehealth: Payer: Self-pay

## 2017-01-27 DIAGNOSIS — E7849 Other hyperlipidemia: Secondary | ICD-10-CM

## 2017-01-27 NOTE — Telephone Encounter (Signed)
Yeah, my license is only active in [place].  I can send to chain pharmacies for non-controlled substances only but not to private pharmacies like that one.  Klonopin will have to be done locally.

## 2017-01-27 NOTE — Telephone Encounter (Signed)
Pt is headed to [employer] for work for the next 3 months.  He is not going to have time to go by pharmacy to pick up his Klonapin and crestor.  He is requesting it be sent to [facility], [place], [phone].  I told him that I wasn't sure as far as the Klonapin as I have had trouble with out of stated pharmacy's and this medication previously.  Please Advise.

## 2017-01-29 MED ORDER — ROSUVASTATIN CALCIUM 40 MG PO TABS: 40.0000 mg | ORAL_TABLET | Freq: Every day | ORAL | 0 refills | Status: DC

## 2017-01-29 MED ORDER — CLONAZEPAM 0.5 MG PO TABS: ORAL_TABLET | ORAL | 0 refills | Status: DC

## 2017-02-05 ENCOUNTER — Telehealth (INDEPENDENT_AMBULATORY_CARE_PROVIDER_SITE_OTHER): Payer: Self-pay | Admitting: Family

## 2017-02-05 ENCOUNTER — Ambulatory Visit (INDEPENDENT_AMBULATORY_CARE_PROVIDER_SITE_OTHER): Payer: BLUE CROSS/BLUE SHIELD | Admitting: Family

## 2017-02-05 ENCOUNTER — Encounter (INDEPENDENT_AMBULATORY_CARE_PROVIDER_SITE_OTHER): Payer: Self-pay | Admitting: Family

## 2017-02-05 VITALS — BP 127/84 | HR 56 | Temp 97.7°F | Resp 14 | Ht 67.72 in | Wt 205.0 lb

## 2017-02-05 DIAGNOSIS — G4710 Hypersomnia, unspecified: Secondary | ICD-10-CM

## 2017-02-05 DIAGNOSIS — Z6831 Body mass index (BMI) 31.0-31.9, adult: Secondary | ICD-10-CM

## 2017-02-05 DIAGNOSIS — G4730 Sleep apnea, unspecified: Secondary | ICD-10-CM

## 2017-02-05 MED ORDER — AMPHETAMINE-DEXTROAMPHETAMINE 10 MG OR TABS
10.0000 mg | ORAL_TABLET | Freq: Two times a day (BID) | ORAL | 0 refills | Status: AC
Start: 2017-02-05 — End: ?

## 2017-02-05 MED ORDER — AMPHETAMINE-DEXTROAMPHETAMINE 10 MG OR TABS
10.0000 mg | ORAL_TABLET | Freq: Two times a day (BID) | ORAL | 0 refills | Status: DC
Start: 2017-02-05 — End: 2017-02-05

## 2017-02-05 NOTE — Progress Notes (Signed)
CHIEF Complaint(s)  Brent Riley is a 46 year old Male here with chief complaint(s): ADHD medication for diagnosed ADHD and he has sleep apnea with day time somulence. He has seen some improvement with bupropion. He completed sleep study with online program.  He ordered a home sleep study online. He completed it for $230 dollar.  1-800-CPAP.  This was positive for sleep apnea but he can afford the in hospital program and is not sure if insurance will cover equipment.    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 He has not been subjected to job performance issues but is concerned.   Would like to proceed with the stimulant again. [place] started [place] again [place].    Allergies-Simvastatin    Review of Systems:  Constitution: sleep has improved some with less smoking and alcohol. More activity with weather improvement. Taking VItamin D.  Cardio: No chest pain or palpitations  Neuro: mild headaches off and on.  Endo: no hx of low thyroid  Mental: denies anxiety or depression and has been tested.    No past medical history on file.  No past surgical history on file.  family history is not on file.  No past surgical history on file.  Social History     Social History   . Marital status: N/A     Spouse name: N/A   . Number of children: N/A   . Years of education: N/A     Occupational History   . Not on file.     Social History Main Topics   . Smoking status: Never Smoker   . Smokeless tobacco: Not on file   . Alcohol use No   . Drug use: Unknown   . Sexual activity: Not on file     Other Topics Concern   . Not on file     Social History Narrative   . No narrative on file       Patient Active Problem List   Diagnosis   . Cognitive attention deficit   . Primary snoring   . Other hyperlipidemia     Current Outpatient Prescriptions   Medication Sig Dispense Refill   . BuPROPion HCl  100 MG Oral Tab Take 1 tablet (100 mg) by mouth 3 times a day. 90 tablet 2   . CloNIDine HCl 0.1 MG Oral Tab One tablet 30 minutes before bedtime. 30 tablet 3   . Rosuvastatin Calcium (CRESTOR OR)        No current facility-administered medications for this visit.      OBJECTIVE:  Constitution: patient appears alert, oriented and articulates his concerns.  Vitals are stable today.   Heart Regular rate at 56/min and rhythm, S1 and S2 present without murmur  Lungs: Clear no rales, rhonchi, breath sounds equal  Neuro: alert, smiling, speech clear, balance intact, strength intact  Mental: Record review noted   Phq 9 and GaD 7 = negative      Health Maintenance reviewed - reviewed, reminded and ordered.    ASSESSMENT AND PLAN    (G47.10,  G47.30) Sleep apnea with hypersomnolence  (primary encounter diagnosis)  Plan: Amphetamine-Dextroamphetamine 10 MG Oral Tab,     Continue bupropion  Continue Clonidine  I will contact his insurance and together we will work towards obtaining his CPAP  Orders per documented in this encounter  Patient instructions per documented in this encounter          Patient Instructions     It was a pleasure to see you in clinic today.                If you are not yet signed up for eCare, please see the below eCare section at the end of this document for how to enroll and to get your access codes.  It's easy to sign up at home today.    eCare  enrollment will allow you access to the below benefits   You can make appointments online   View test results / Lab Results   Request prescription renewals   Obtain a copy of our After Visit Summary (an electronic copy of this document)     Your Test Results:  If labs were ordered today the results are expected to be available via eCare in about 5 days. If you have an active eCare account, this is how we will notify you of your results.     If you do not have an eCare account then your test results will be mailed to you within about 14 days after  your tests are completed. If your physician needs to change your care based on your results or is concerned, you should expect a phone call or eCare message from your provider.    If you have any questions about your test results please schedule an appointment with your provider, so that we may review them with you in greater detail.    **If it has been more than 2 weeks and you have not received your test results please send our office a message via eCare.    Medication Refills: If you need a prescription refilled, please contact your pharmacy 1 week before your current supply will run out to request the refill.  Contacting your pharmacy is the fastest and safest way to obtain a medication refill.  The pharmacy will notify our office.  Please note, that a minimum of 48 to 72 hours is needed to refill a medication,  Please call your pharmacy early to allow enough time to refill before you anticipate running out.  For faster medication refills, you can also schedule an appointment with your provider.    We know you have a choice in where you receive your healthcare and we sincerely thank you for trusting [facility] with your health.

## 2017-02-05 NOTE — Patient Instructions (Signed)
It was a pleasure to see you in clinic today.                If you are not yet signed up for eCare, please see the below eCare section at the end of this document for how to enroll and to get your access codes.  It's easy to sign up at home today.    eCare  enrollment will allow you access to the below benefits   You can make appointments online   View test results / Lab Results   Request prescription renewals   Obtain a copy of our After Visit Summary (an electronic copy of this document)     Your Test Results:  If labs were ordered today the results are expected to be available via eCare in about 5 days. If you have an active eCare account, this is how we will notify you of your results.     If you do not have an eCare account then your test results will be mailed to you within about 14 days after your tests are completed. If your physician needs to change your care based on your results or is concerned, you should expect a phone call or eCare message from your provider.    If you have any questions about your test results please schedule an appointment with your provider, so that we may review them with you in greater detail.    **If it has been more than 2 weeks and you have not received your test results please send our office a message via eCare.    Medication Refills: If you need a prescription refilled, please contact your pharmacy 1 week before your current supply will run out to request the refill.  Contacting your pharmacy is the fastest and safest way to obtain a medication refill.  The pharmacy will notify our office.  Please note, that a minimum of 48 to 72 hours is needed to refill a medication,  Please call your pharmacy early to allow enough time to refill before you anticipate running out.  For faster medication refills, you can also schedule an appointment with your provider.    We know you have a choice in where you receive your healthcare and we sincerely thank you for trusting [facility] with your health.

## 2017-02-05 NOTE — Telephone Encounter (Signed)
1st attempt - Left message requesting patient to call back.    CCR: If patient calls back, please relay message below. If unable, please take a message and forward the TE to the appropriate Clinical Staff Pool and verify if a detailed VM can be left. If no further action needed, please close encounter.       Rx Amphetamine-Dextroamphetamine ready for pick up at front desk.

## 2017-02-05 NOTE — Progress Notes (Signed)
Reviewed eCare status with Patient:  NO    HEALTH MAINTENANCE:  Has the patient had any of these since their last visit?    Cervical screening/PAP: Not Due     Mammo: Not Due    Colon Screen: Not Due    Diabetic Eye Exam: Not Due    Have you seen a specialist since your last visit: No    HM Due:   Health Maintenance   Topic Date Due   . HIV Screening  09/27/1984   . Tetanus Vaccine  09/27/1988   . Lipid Disorders Screening  09/27/2004   . Diabetes Screening  09/27/2009   . Influenza Vaccine (Season Ended) 05/26/2017   . Depression Screening (PHQ-2)  12/23/2017     Future Appointments  Date Time Provider Department Center   02/05/2017 10:00 AM Daphane ShepherdMeyer, Delcie RochKerry Ellen, ARNP [facility]

## 2017-02-10 ENCOUNTER — Encounter (INDEPENDENT_AMBULATORY_CARE_PROVIDER_SITE_OTHER): Payer: Self-pay | Admitting: Family

## 2017-02-10 NOTE — Telephone Encounter (Signed)
Please see Ecare message and advise.

## 2017-03-12 ENCOUNTER — Other Ambulatory Visit (INDEPENDENT_AMBULATORY_CARE_PROVIDER_SITE_OTHER): Payer: Self-pay | Admitting: Family

## 2017-03-12 DIAGNOSIS — G4710 Hypersomnia, unspecified: Secondary | ICD-10-CM

## 2017-03-12 DIAGNOSIS — G4730 Sleep apnea, unspecified: Secondary | ICD-10-CM

## 2017-03-13 MED ORDER — AMPHETAMINE-DEXTROAMPHET ER 20 MG OR CP24
20.0000 mg | EXTENDED_RELEASE_CAPSULE | Freq: Every day | ORAL | 0 refills | Status: AC
Start: 2017-03-13 — End: ?

## 2017-03-13 NOTE — Telephone Encounter (Signed)
Routing to provider since Refill Authorization Center cannot authorize controlled substances.     Please note, also requesting Adderall XR formulation

## 2017-03-13 NOTE — Telephone Encounter (Signed)
Ok for refill and change to ER  Needs to make a follow appt with Nash DimmerKerry

## 2017-03-14 NOTE — Telephone Encounter (Signed)
Rx script placed in the patient pick up.    Called left message notified Rx script ready for pick up at the clinic front desk.

## 2017-03-15 ENCOUNTER — Telehealth (INDEPENDENT_AMBULATORY_CARE_PROVIDER_SITE_OTHER): Payer: Self-pay | Admitting: Family Medicine

## 2017-03-15 NOTE — Telephone Encounter (Signed)
Patient picked up Adderall xr on 03/15/17

## 2017-06-05 ENCOUNTER — Telehealth (INDEPENDENT_AMBULATORY_CARE_PROVIDER_SITE_OTHER): Payer: Self-pay | Admitting: Family

## 2017-06-05 NOTE — Telephone Encounter (Signed)
Rx for Adderall   never picked up. Rx dated 02/05/17. Rx has been discontinued and hard copy shredded.     No further action needed Closing TE

## 2017-11-04 MED FILL — cloNIDine HCL 0.1 MG TABS: 0.1 | 30 days supply | Qty: 30 | Fill #0

## 2017-11-04 MED FILL — ROSUVASTATIN CALCIUM 40 MG: 40 | 30 days supply | Qty: 30 | Fill #0

## 2017-11-04 MED FILL — OLMESARTAN MEDOXOMIL 5 MG T: 5 | 90 days supply | Qty: 90 | Fill #0

## 2017-12-08 MED FILL — ROSUVASTATIN CALCIUM 40 MG: 40 | 30 days supply | Qty: 30 | Fill #1

## 2017-12-23 DIAGNOSIS — F900 Attention-deficit hyperactivity disorder, predominantly inattentive type: Secondary | ICD-10-CM | POA: Diagnosis not present

## 2017-12-24 DIAGNOSIS — R369 Urethral discharge, unspecified: Secondary | ICD-10-CM | POA: Diagnosis not present

## 2017-12-24 DIAGNOSIS — Z7251 High risk heterosexual behavior: Secondary | ICD-10-CM | POA: Diagnosis not present

## 2017-12-24 MED FILL — DOXYCYCLINE HYCLATE 100 MG: 100 | 7 days supply | Qty: 14 | Fill #0

## 2018-01-13 DIAGNOSIS — F900 Attention-deficit hyperactivity disorder, predominantly inattentive type: Secondary | ICD-10-CM | POA: Diagnosis not present

## 2018-01-20 MED FILL — ROSUVASTATIN CALCIUM 40 MG: 40 | 30 days supply | Qty: 30 | Fill #2

## 2018-02-10 ENCOUNTER — Ambulatory Visit (INDEPENDENT_AMBULATORY_CARE_PROVIDER_SITE_OTHER): Payer: BLUE CROSS/BLUE SHIELD | Admitting: Sports Medicine

## 2018-02-10 VITALS — BP 117/75 | HR 58 | Ht 69.0 in | Wt 208.0 lb

## 2018-02-10 DIAGNOSIS — F17200 Nicotine dependence, unspecified, uncomplicated: Secondary | ICD-10-CM | POA: Diagnosis not present

## 2018-02-10 DIAGNOSIS — Z0000 Encounter for general adult medical examination without abnormal findings: Secondary | ICD-10-CM

## 2018-02-10 DIAGNOSIS — G8929 Other chronic pain: Secondary | ICD-10-CM | POA: Diagnosis not present

## 2018-02-10 DIAGNOSIS — F900 Attention-deficit hyperactivity disorder, predominantly inattentive type: Secondary | ICD-10-CM | POA: Diagnosis not present

## 2018-02-10 DIAGNOSIS — M25511 Pain in right shoulder: Secondary | ICD-10-CM

## 2018-02-10 DIAGNOSIS — Z122 Encounter for screening for malignant neoplasm of respiratory organs: Secondary | ICD-10-CM

## 2018-02-10 NOTE — Assessment & Plan Note (Signed)
Rotator cuff tendinosis, injected about a year and 5 months ago. He does have a history of a negative x-ray from an outside facility recently. Has failed greater than 6 weeks of physician directed rehab. He is going to have an MRI but he would like to send me a MyChart message and let me know the location.

## 2018-02-10 NOTE — Progress Notes (Signed)
Subjective:    CC: Annual physical  HPI:  Seth Waters is here for his physical, he would like some specific screening done, he is also a 30-pack-year smoker, quit recently.  He would like a screening CT of the chest.  He would also like a PSA test done.  Right shoulder pain: Present now for several years, impingement type symptoms, subacromial injection over a year ago he tells me provided no relief, he tells me he is also been doing his rehab exercises.  Hypertension: Controlled.  Upper lipidemia: Controlled historically.  I reviewed the past medical history, family history, social history, surgical history, and allergies today and no changes were needed.  Please see the problem list section below in epic for further details.  Past Medical History: Past Medical History:  Diagnosis Date  . Anxiety   . Depression   . Hyperlipidemia   . Hyperlipidemia   . Hypertension    Past Surgical History: Past Surgical History:  Procedure Laterality Date  . HERNIA REPAIR    . high cholesterol     Social History: Social History   Socioeconomic History  . Marital status: Single    Spouse name: Not on file  . Number of children: Not on file  . Years of education: Not on file  . Highest education level: Not on file  Occupational History  . Not on file  Social Needs  . Financial resource strain: Not on file  . Food insecurity:    Worry: Not on file    Inability: Not on file  . Transportation needs:    Medical: Not on file    Non-medical: Not on file  Tobacco Use  . Smoking status: Current Every Day Smoker    Packs/day: 0.25    Years: 15.00    Pack years: 3.75    Types: Cigarettes  . Smokeless tobacco: Former Systems developer    Quit date: 10/11/2011  Substance and Sexual Activity  . Alcohol use: No  . Drug use: No  . Sexual activity: Not on file  Lifestyle  . Physical activity:    Days per week: Not on file    Minutes per session: Not on file  . Stress: Not on file  Relationships  .  Social connections:    Talks on phone: Not on file    Gets together: Not on file    Attends religious service: Not on file    Active member of club or organization: Not on file    Attends meetings of clubs or organizations: Not on file    Relationship status: Not on file  Other Topics Concern  . Not on file  Social History Narrative  . Not on file   Family History: Family History  Problem Relation Age of Onset  . Hyperlipidemia Mother   . Hypertension Mother   . Alcohol abuse Father    Allergies: Allergies  Allergen Reactions  . Simvastatin Swelling   Medications: See med rec.  Review of Systems: No headache, visual changes, nausea, vomiting, diarrhea, constipation, dizziness, abdominal pain, skin rash, fevers, chills, night sweats, swollen lymph nodes, weight loss, chest pain, body aches, joint swelling, muscle aches, shortness of breath, mood changes, visual or auditory hallucinations.  Objective:    General: Well Developed, well nourished, and in no acute distress.  Neuro: Alert and oriented x3, extra-ocular muscles intact, sensation grossly intact. Cranial nerves II through XII are intact, motor, sensory, and coordinative functions are all intact. HEENT: Normocephalic, atraumatic, pupils equal round reactive to  light, neck supple, no masses, no lymphadenopathy, thyroid nonpalpable. Oropharynx, nasopharynx, external ear canals are unremarkable. Skin: Warm and dry, no rashes noted.  Cardiac: Regular rate and rhythm, no murmurs rubs or gallops.  Respiratory: Clear to auscultation bilaterally. Not using accessory muscles, speaking in full sentences.  Abdominal: Soft, nontender, nondistended, positive bowel sounds, no masses, no organomegaly.  Right shoulder: Inspection reveals no abnormalities, atrophy or asymmetry. Palpation is normal with no tenderness over AC joint or bicipital groove. ROM is full in all planes. Rotator cuff strength normal throughout. Negative Hawkins,  empty can signs, positive Neer sign. Speeds and Yergason's tests normal. No labral pathology noted with negative Obrien's, negative crank, negative clunk, and good stability. Normal scapular function observed. No painful arc and no drop arm sign. No apprehension sign  Impression and Recommendations:    The patient was counselled, risk factors were discussed, anticipatory guidance given.  Annual physical exam Annual physical as above, routine labs ordered. Patient would like PSA screening.  Smoker Ordering low-dose CT lung cancer screening per patient request, he understands he may have to pay for it.  Right shoulder pain Rotator cuff tendinosis, injected about a year and 5 months ago. He does have a history of a negative x-ray from an outside facility recently. Has failed greater than 6 weeks of physician directed rehab. He is going to have an MRI but he would like to send me a MyChart message and let me know the location. ___________________________________________ Gwen Her. Dianah Field, M.D., ABFM., CAQSM. Primary Care and [place] Instructor of [place] [employer] [facility]

## 2018-02-10 NOTE — Assessment & Plan Note (Signed)
Annual physical as above, routine labs ordered. Patient would like PSA screening.

## 2018-02-10 NOTE — Assessment & Plan Note (Signed)
Ordering low-dose CT lung cancer screening per patient request, he understands he may have to pay for it.

## 2018-02-20 DIAGNOSIS — Z0000 Encounter for general adult medical examination without abnormal findings: Secondary | ICD-10-CM | POA: Diagnosis not present

## 2018-02-23 ENCOUNTER — Other Ambulatory Visit: Payer: Self-pay | Admitting: *Deleted

## 2018-02-23 DIAGNOSIS — E7849 Other hyperlipidemia: Secondary | ICD-10-CM

## 2018-02-23 LAB — COMPREHENSIVE METABOLIC PANEL
AG Ratio: 1.7 (calc) (ref 1.0–2.5)
ALT: 31 U/L (ref 9–46)
BUN: 20 mg/dL (ref 7–25)
CO2: 27 mmol/L (ref 20–32)
Creat: 0.89 mg/dL (ref 0.60–1.35)
Glucose, Bld: 100 mg/dL — ABNORMAL HIGH (ref 65–99)
Sodium: 136 mmol/L (ref 135–146)
Total Protein: 6.9 g/dL (ref 6.1–8.1)

## 2018-02-23 LAB — COMPREHENSIVE METABOLIC PANEL WITH GFR
AST: 29 U/L (ref 10–40)
Albumin: 4.3 g/dL (ref 3.6–5.1)
Alkaline phosphatase (APISO): 67 U/L (ref 40–115)
Calcium: 9.6 mg/dL (ref 8.6–10.3)
Chloride: 103 mmol/L (ref 98–110)
Globulin: 2.6 g/dL (ref 1.9–3.7)
Potassium: 4.9 mmol/L (ref 3.5–5.3)
Total Bilirubin: 0.6 mg/dL (ref 0.2–1.2)

## 2018-02-23 LAB — PSA, TOTAL AND FREE
PSA, % Free: 14 % (calc) — ABNORMAL LOW (ref >25)
PSA, Free: 0.1 ng/mL
PSA, Total: 0.7 ng/mL (ref <=4.0)

## 2018-02-23 LAB — LIPID PANEL W/REFLEX DIRECT LDL
Cholesterol: 169 mg/dL (ref <200)
HDL: 47 mg/dL (ref >40)
LDL Cholesterol (Calc): 104 mg/dL (calc) — ABNORMAL HIGH
Non-HDL Cholesterol (Calc): 122 mg/dL (calc) (ref <130)
Total CHOL/HDL Ratio: 3.6 (calc) (ref <5.0)
Triglycerides: 85 mg/dL (ref <150)

## 2018-02-23 LAB — CBC
HCT: 44.2 % (ref 38.5–50.0)
Hemoglobin: 14.9 g/dL (ref 13.2–17.1)
MCH: 30.9 pg (ref 27.0–33.0)
MCHC: 33.7 g/dL (ref 32.0–36.0)
MCV: 91.7 fL (ref 80.0–100.0)
MPV: 10.3 fL (ref 7.5–12.5)
Platelets: 239 Thousand/uL (ref 140–400)
RBC: 4.82 10*6/uL (ref 4.20–5.80)
RDW: 12.8 % (ref 11.0–15.0)
WBC: 5.7 10*3/uL (ref 3.8–10.8)

## 2018-02-23 LAB — VITAMIN D 25 HYDROXY (VIT D DEFICIENCY, FRACTURES): Vit D, 25-Hydroxy: 33 ng/mL (ref 30–100)

## 2018-02-23 MED ORDER — ROSUVASTATIN CALCIUM 40 MG PO TABS: 40.0000 mg | ORAL_TABLET | Freq: Every day | ORAL | 3 refills | Status: DC

## 2018-03-09 MED FILL — ROSUVASTATIN CALCIUM 40 MG: 40 | 90 days supply | Qty: 90 | Fill #0

## 2018-03-20 DIAGNOSIS — F900 Attention-deficit hyperactivity disorder, predominantly inattentive type: Secondary | ICD-10-CM | POA: Diagnosis not present

## 2018-08-05 ENCOUNTER — Other Ambulatory Visit: Payer: Self-pay | Admitting: Psychiatry

## 2018-08-05 MED ORDER — AMPHETAMINE-DEXTROAMPHETAMINE 10 MG PO TABS: 10.0000 mg | ORAL_TABLET | Freq: Every day | ORAL | 0 refills | Status: DC

## 2018-08-17 MED FILL — ROSUVASTATIN CALCIUM 40 MG: 40 | 90 days supply | Qty: 90 | Fill #1

## 2018-09-30 ENCOUNTER — Other Ambulatory Visit: Payer: Self-pay | Admitting: Psychiatry

## 2018-09-30 MED ORDER — AMPHETAMINE-DEXTROAMPHETAMINE 10 MG PO TABS: 10.0000 mg | ORAL_TABLET | Freq: Every day | ORAL | 0 refills | Status: DC

## 2019-02-15 MED FILL — ROSUVASTATIN CALCIUM 40 MG: 40 | 90 days supply | Qty: 90 | Fill #2

## 2019-03-11 ENCOUNTER — Ambulatory Visit: Payer: Self-pay | Admitting: Psychiatry

## 2019-07-27 ENCOUNTER — Telehealth: Payer: Self-pay | Admitting: Sports Medicine

## 2019-07-27 ENCOUNTER — Encounter: Payer: Self-pay | Admitting: Sports Medicine

## 2019-07-27 NOTE — Telephone Encounter (Signed)
Left message asking for a return call. He will not be due until age of 48 years old. Unless he has a family history of early colon cancer.

## 2019-07-27 NOTE — Telephone Encounter (Signed)
Okay thanks. I sent a mychart message as well.

## 2019-07-27 NOTE — Telephone Encounter (Signed)
Pt sent msg via mychart for referral to get  a Colonoscopy.  Thanks.

## 2019-08-04 ENCOUNTER — Encounter: Payer: Self-pay | Admitting: Sports Medicine

## 2019-08-04 DIAGNOSIS — Z1211 Encounter for screening for malignant neoplasm of colon: Secondary | ICD-10-CM

## 2019-08-04 DIAGNOSIS — Z0000 Encounter for general adult medical examination without abnormal findings: Secondary | ICD-10-CM

## 2019-08-04 DIAGNOSIS — R972 Elevated prostate specific antigen [PSA]: Secondary | ICD-10-CM

## 2019-08-04 DIAGNOSIS — E785 Hyperlipidemia, unspecified: Secondary | ICD-10-CM

## 2019-08-04 NOTE — Telephone Encounter (Signed)
Cologuard ordered and faxed.

## 2019-08-05 NOTE — Telephone Encounter (Signed)
I have already ordered his Cologuard and faxed it.  Labs pended for [facility]. Are you ok with his doing virtual physical?

## 2019-08-05 NOTE — Telephone Encounter (Signed)
Absolutely okay with a virtual physical exam, though he needs to understand the limitations, we can certainly do this in a 15-minute slot as well, mostly just to catch him up on his preventive measures.  Thank you for doing Cologuard.

## 2019-08-23 ENCOUNTER — Ambulatory Visit (INDEPENDENT_AMBULATORY_CARE_PROVIDER_SITE_OTHER): Payer: 59 | Admitting: Sports Medicine

## 2019-08-23 ENCOUNTER — Other Ambulatory Visit: Payer: Self-pay

## 2019-08-23 VITALS — BP 121/86 | HR 70 | Ht 69.0 in | Wt 220.0 lb

## 2019-08-23 DIAGNOSIS — F329 Major depressive disorder, single episode, unspecified: Secondary | ICD-10-CM

## 2019-08-23 DIAGNOSIS — Z125 Encounter for screening for malignant neoplasm of prostate: Secondary | ICD-10-CM | POA: Diagnosis not present

## 2019-08-23 DIAGNOSIS — F419 Anxiety disorder, unspecified: Secondary | ICD-10-CM

## 2019-08-23 DIAGNOSIS — Z0000 Encounter for general adult medical examination without abnormal findings: Secondary | ICD-10-CM

## 2019-08-23 DIAGNOSIS — E7849 Other hyperlipidemia: Secondary | ICD-10-CM | POA: Diagnosis not present

## 2019-08-23 DIAGNOSIS — M722 Plantar fascial fibromatosis: Secondary | ICD-10-CM | POA: Diagnosis not present

## 2019-08-23 DIAGNOSIS — F32A Depression, unspecified: Secondary | ICD-10-CM

## 2019-08-23 DIAGNOSIS — Z23 Encounter for immunization: Secondary | ICD-10-CM | POA: Diagnosis not present

## 2019-08-23 MED ORDER — CLONAZEPAM 0.5 MG PO TABS: ORAL_TABLET | ORAL | 0 refills | Status: DC

## 2019-08-23 MED ORDER — ROSUVASTATIN CALCIUM 40 MG PO TABS: 40.0000 mg | ORAL_TABLET | Freq: Every day | ORAL | 3 refills | Status: DC

## 2019-08-23 MED FILL — ROSUVASTATIN CALCIUM 40 MG: 40 | 30 days supply | Qty: 30 | Fill #0

## 2019-08-23 MED FILL — clonazePAM 0.5 MG TABS: 0.5 | 15 days supply | Qty: 30 | Fill #0

## 2019-08-23 NOTE — Assessment & Plan Note (Signed)
Referral to Dr. Raeford Razor for custom orthotics.

## 2019-08-23 NOTE — Assessment & Plan Note (Signed)
Overall doing well, refilling clonazepam.

## 2019-08-23 NOTE — Assessment & Plan Note (Signed)
Rechecking lipids, refilling Crestor

## 2019-08-23 NOTE — Progress Notes (Signed)
Subjective:    CC: Health maintenance  HPI: Seth Waters is here to assess health maintenance, he needs a refill on some medications, he would also like to make sure he is getting the age-appropriate cancer screening.  I reviewed the past medical history, family history, social history, surgical history, and allergies today and no changes were needed.  Please see the problem list section below in epic for further details.  Past Medical History: Past Medical History:  Diagnosis Date  . Anxiety   . Depression   . Hyperlipidemia   . Hyperlipidemia   . Hypertension    Past Surgical History: Past Surgical History:  Procedure Laterality Date  . HERNIA REPAIR    . high cholesterol     Social History: Social History   Socioeconomic History  . Marital status: Single    Spouse name: Not on file  . Number of children: Not on file  . Years of education: Not on file  . Highest education level: Not on file  Occupational History  . Not on file  Tobacco Use  . Smoking status: Current Every Day Smoker    Packs/day: 0.25    Years: 15.00    Pack years: 3.75    Types: Cigarettes  . Smokeless tobacco: Former Systems developer    Quit date: 10/11/2011  Substance and Sexual Activity  . Alcohol use: No  . Drug use: No  . Sexual activity: Not on file  Other Topics Concern  . Not on file  Social History Narrative  . Not on file   Social Determinants of Health   Financial Resource Strain:   . Difficulty of Paying Living Expenses: Not on file  Food Insecurity:   . Worried About Charity fundraiser in the Last Year: Not on file  . Ran Out of Food in the Last Year: Not on file  Transportation Needs:   . Lack of Transportation (Medical): Not on file  . Lack of Transportation (Non-Medical): Not on file  Physical Activity:   . Days of Exercise per Week: Not on file  . Minutes of Exercise per Session: Not on file  Stress:   . Feeling of Stress : Not on file  Social Connections:   . Frequency of  Communication with Friends and Family: Not on file  . Frequency of Social Gatherings with Friends and Family: Not on file  . Attends Religious Services: Not on file  . Active Member of Clubs or Organizations: Not on file  . Attends Archivist Meetings: Not on file  . Marital Status: Not on file   Family History: Family History  Problem Relation Age of Onset  . Hyperlipidemia Mother   . Hypertension Mother   . Alcohol abuse Father    Allergies: Allergies  Allergen Reactions  . Simvastatin Swelling   Medications: See med rec.  Review of Systems: No fevers, chills, night sweats, weight loss, chest pain, or shortness of breath.   Objective:    General: Well Developed, well nourished, and in no acute distress.  Neuro: Alert and oriented x3, extra-ocular muscles intact, sensation grossly intact.  HEENT: Normocephalic, atraumatic, pupils equal round reactive to light, neck supple, no masses, no lymphadenopathy, thyroid nonpalpable.  Skin: Warm and dry, no rashes. Cardiac: Regular rate and rhythm, no murmurs rubs or gallops, no lower extremity edema.  Respiratory: Clear to auscultation bilaterally. Not using accessory muscles, speaking in full sentences.  Impression and Recommendations:    Annual physical exam Referral for colonoscopy, initially he  had wanted to do Cologuard but would actually like to do colonoscopy instead.  He turns 49 in February.  Hyperlipidemia Rechecking lipids, refilling Crestor  Plantar fasciitis Referral to Dr. Raeford Razor for custom orthotics.  Anxiety and depression Overall doing well, refilling clonazepam.  I spent 25 minutes with this patient, greater than 50% was face-to-face time counseling regarding the above diagnoses.  ___________________________________________ Gwen Her. Dianah Field, M.D., ABFM., CAQSM. Primary Care and Sports Medicine [facility]  Adjunct Professor of [employer]

## 2019-08-23 NOTE — Assessment & Plan Note (Signed)
Referral for colonoscopy, initially he had wanted to do Cologuard but would actually like to do colonoscopy instead.  He turns 49 in February.

## 2019-09-03 LAB — CBC
Hematocrit: 45.8 % (ref 37.5–51.0)
Hemoglobin: 15.8 g/dL (ref 13.0–17.7)
MCH: 30.8 pg (ref 26.6–33.0)
MCHC: 34.5 g/dL (ref 31.5–35.7)
MCV: 89 fL (ref 79–97)
Platelets: 267 10*3/uL (ref 150–450)
RBC: 5.13 x10E6/uL (ref 4.14–5.80)
RDW: 13 % (ref 11.6–15.4)
WBC: 8.1 10*3/uL (ref 3.4–10.8)

## 2019-09-03 LAB — COMPREHENSIVE METABOLIC PANEL
ALT: 65 IU/L — ABNORMAL HIGH (ref 0–44)
AST: 47 IU/L — ABNORMAL HIGH (ref 0–40)
Albumin/Globulin Ratio: 1.6 (ref 1.2–2.2)
Albumin: 4.6 g/dL (ref 4.0–5.0)
Alkaline Phosphatase: 66 IU/L (ref 39–117)
BUN/Creatinine Ratio: 19 (ref 9–20)
BUN: 18 mg/dL (ref 6–24)
Bilirubin Total: 0.6 mg/dL (ref 0.0–1.2)
CO2: 23 mmol/L (ref 20–29)
Calcium: 9.3 mg/dL (ref 8.7–10.2)
Chloride: 103 mmol/L (ref 96–106)
Creatinine, Ser: 0.97 mg/dL (ref 0.76–1.27)
GFR calc Af Amer: 106 mL/min/{1.73_m2} (ref >59)
GFR calc non Af Amer: 91 mL/min/{1.73_m2} (ref >59)
Globulin, Total: 2.9 g/dL (ref 1.5–4.5)
Glucose: 93 mg/dL (ref 65–99)
Potassium: 4.7 mmol/L (ref 3.5–5.2)
Sodium: 140 mmol/L (ref 134–144)
Total Protein: 7.5 g/dL (ref 6.0–8.5)

## 2019-09-03 LAB — PSA, TOTAL AND FREE
PSA, Free Pct: 15 %
PSA, Free: 0.3 ng/mL
Prostate Specific Ag, Serum: 2 ng/mL (ref 0.0–4.0)

## 2019-09-03 LAB — LIPID PANEL
Chol/HDL Ratio: 4.2 ratio (ref 0.0–5.0)
Cholesterol, Total: 189 mg/dL (ref 100–199)
HDL: 45 mg/dL (ref >39)
LDL Chol Calc (NIH): 130 mg/dL — ABNORMAL HIGH (ref 0–99)
Triglycerides: 75 mg/dL (ref 0–149)
VLDL Cholesterol Cal: 14 mg/dL (ref 5–40)

## 2019-09-27 ENCOUNTER — Encounter: Payer: 59 | Admitting: Family Medicine

## 2019-09-27 NOTE — Progress Notes (Deleted)
  Seth Waters - 49 y.o. male MRN [id number]  Date of birth: [birth year]  SUBJECTIVE:  Including CC & ROS.  No chief complaint on file.   Seth Waters is a 49 y.o. male that is  ***.  ***   Review of Systems See HPI   HISTORY: Past Medical, Surgical, Social, and Family History Reviewed & Updated per EMR.   Pertinent Historical Findings include:  Past Medical History:  Diagnosis Date  . Anxiety   . Depression   . Hyperlipidemia   . Hyperlipidemia   . Hypertension     Past Surgical History:  Procedure Laterality Date  . HERNIA REPAIR    . high cholesterol      Allergies  Allergen Reactions  . Simvastatin Swelling    Family History  Problem Relation Age of Onset  . Hyperlipidemia Mother   . Hypertension Mother   . Alcohol abuse Father      Social History   Socioeconomic History  . Marital status: Single    Spouse name: Not on file  . Number of children: Not on file  . Years of education: Not on file  . Highest education level: Not on file  Occupational History  . Not on file  Tobacco Use  . Smoking status: Current Every Day Smoker    Packs/day: 0.25    Years: 15.00    Pack years: 3.75    Types: Cigarettes  . Smokeless tobacco: Former Systems developer    Quit date: 10/11/2011  Substance and Sexual Activity  . Alcohol use: No  . Drug use: No  . Sexual activity: Not on file  Other Topics Concern  . Not on file  Social History Narrative  . Not on file   Social Determinants of Health   Financial Resource Strain:   . Difficulty of Paying Living Expenses: Not on file  Food Insecurity:   . Worried About Charity fundraiser in the Last Year: Not on file  . Ran Out of Food in the Last Year: Not on file  Transportation Needs:   . Lack of Transportation (Medical): Not on file  . Lack of Transportation (Non-Medical): Not on file  Physical Activity:   . Days of Exercise per Week: Not on file  . Minutes of Exercise per Session: Not on file  Stress:   . Feeling  of Stress : Not on file  Social Connections:   . Frequency of Communication with Friends and Family: Not on file  . Frequency of Social Gatherings with Friends and Family: Not on file  . Attends Religious Services: Not on file  . Active Member of Clubs or Organizations: Not on file  . Attends Archivist Meetings: Not on file  . Marital Status: Not on file  Intimate Partner Violence:   . Fear of Current or Ex-Partner: Not on file  . Emotionally Abused: Not on file  . Physically Abused: Not on file  . Sexually Abused: Not on file     PHYSICAL EXAM:  VS: There were no vitals taken for this visit. Physical Exam Gen: NAD, alert, cooperative with exam, well-appearing ENT: normal lips, normal nasal mucosa,  Eye: normal EOM, normal conjunctiva and lids Skin: no rashes, no areas of induration  Neuro: normal tone, normal sensation to touch Psych:  normal insight, alert and oriented MSK:  ***      ASSESSMENT & PLAN:   No problem-specific Assessment & Plan notes found for this encounter.

## 2019-09-28 ENCOUNTER — Ambulatory Visit: Payer: 59 | Admitting: Family Medicine

## 2019-09-28 ENCOUNTER — Encounter: Payer: Self-pay | Admitting: Family Medicine

## 2019-09-28 ENCOUNTER — Other Ambulatory Visit: Payer: Self-pay

## 2019-09-28 DIAGNOSIS — M722 Plantar fascial fibromatosis: Secondary | ICD-10-CM

## 2019-09-28 MED FILL — ROSUVASTATIN CALCIUM 40 MG: 40 | 30 days supply | Qty: 30 | Fill #1

## 2019-09-28 NOTE — Assessment & Plan Note (Signed)
Has been improved with orthotics in the past with his long days on his feet. -Counseled on supportive care. -Custom orthotics.

## 2019-09-28 NOTE — Progress Notes (Signed)
Seth Waters - 49 y.o. male MRN [id number]  Date of birth: May 31, 1971  SUBJECTIVE:  Including CC & ROS.  Chief Complaint  Patient presents with  . Foot Orthotics    Seth Waters is a 49 y.o. male that is presenting with plantar fasciitis.  He has foot pain from working long hours and walking through the course the day.  He is on his feet for most all day.  Has had custom orthotics made in the past which seem to have helped his symptoms..   Review of Systems See HPI   HISTORY: Past Medical, Surgical, Social, and Family History Reviewed & Updated per EMR.   Pertinent Historical Findings include:  Past Medical History:  Diagnosis Date  . Anxiety   . Depression   . Hyperlipidemia   . Hyperlipidemia   . Hypertension     Past Surgical History:  Procedure Laterality Date  . HERNIA REPAIR    . high cholesterol      Allergies  Allergen Reactions  . Simvastatin Swelling    Family History  Problem Relation Age of Onset  . Hyperlipidemia Mother   . Hypertension Mother   . Alcohol abuse Father      Social History   Socioeconomic History  . Marital status: Single    Spouse name: Not on file  . Number of children: Not on file  . Years of education: Not on file  . Highest education level: Not on file  Occupational History  . Not on file  Tobacco Use  . Smoking status: Current Every Day Smoker    Packs/day: 0.25    Years: 15.00    Pack years: 3.75    Types: Cigarettes  . Smokeless tobacco: Former Systems developer    Quit date: 10/11/2011  Substance and Sexual Activity  . Alcohol use: No  . Drug use: No  . Sexual activity: Not on file  Other Topics Concern  . Not on file  Social History Narrative  . Not on file   Social Determinants of Health   Financial Resource Strain:   . Difficulty of Paying Living Expenses: Not on file  Food Insecurity:   . Worried About Charity fundraiser in the Last Year: Not on file  . Ran Out of Food in the Last Year: Not on file    Transportation Needs:   . Lack of Transportation (Medical): Not on file  . Lack of Transportation (Non-Medical): Not on file  Physical Activity:   . Days of Exercise per Week: Not on file  . Minutes of Exercise per Session: Not on file  Stress:   . Feeling of Stress : Not on file  Social Connections:   . Frequency of Communication with Friends and Family: Not on file  . Frequency of Social Gatherings with Friends and Family: Not on file  . Attends Religious Services: Not on file  . Active Member of Clubs or Organizations: Not on file  . Attends Archivist Meetings: Not on file  . Marital Status: Not on file  Intimate Partner Violence:   . Fear of Current or Ex-Partner: Not on file  . Emotionally Abused: Not on file  . Physically Abused: Not on file  . Sexually Abused: Not on file     PHYSICAL EXAM:  VS: BP (!) 151/98   Pulse 74   Ht 5\' 9"  (1.753 m)   Wt 212 lb (96.2 kg)   BMI 31.31 kg/m  Physical Exam Gen: NAD, alert,  cooperative with exam, well-appearing ENT: normal lips, normal nasal mucosa,  Eye: normal EOM, normal conjunctiva and lids Skin: no rashes, no areas of induration  Neuro: normal tone, normal sensation to touch Psych:  normal insight, alert and oriented MSK:  Right and left foot. Mild degenerative changes of the first MTP joint. Fairly cavus foot. Normal strength resistance. Neurovascularly intact  Patient was fitted for a standard, cushioned, semi-rigid orthotic. The orthotic was heated and afterward the patient stood on the orthotic blank positioned on the orthotic stand. The patient was positioned in subtalar neutral position and 10 degrees of ankle dorsiflexion in a weight bearing stance. After completion of molding, a stable base was applied to the orthotic blank. The blank was ground to a stable position for weight bearing. Size: 10 Base: Blue EVA Additional Posting and Padding: None The patient ambulated these, and they were very  comfortable.    ASSESSMENT & PLAN:   Plantar fasciitis Has been improved with orthotics in the past with his long days on his feet. -Counseled on supportive care. -Custom orthotics.

## 2019-12-24 ENCOUNTER — Encounter: Payer: Self-pay | Admitting: Sports Medicine

## 2020-01-11 MED FILL — ROSUVASTATIN CALCIUM 40 MG: 40 | 90 days supply | Qty: 90 | Fill #2

## 2020-01-27 ENCOUNTER — Encounter: Payer: Self-pay | Admitting: Gastroenterology

## 2020-03-15 ENCOUNTER — Other Ambulatory Visit: Payer: Self-pay

## 2020-03-15 ENCOUNTER — Ambulatory Visit (AMBULATORY_SURGERY_CENTER): Payer: Self-pay

## 2020-03-15 VITALS — Ht 69.0 in | Wt 218.0 lb

## 2020-03-15 DIAGNOSIS — Z1211 Encounter for screening for malignant neoplasm of colon: Secondary | ICD-10-CM

## 2020-03-15 NOTE — Progress Notes (Signed)
No egg or soy allergy known to patient  No issues with past sedation with any surgeries or procedures No intubation problems in the past  No diet pills per patient No home 02 use per patient  No blood thinners per patient  Pt denies issues with constipation  No A fib or A flutter  EMMI video to pt and in [place] 19 guidelines implemented in PV today   COVID vaccines completed on 09/2019 per pt;   Due to the COVID-19 pandemic we are asking patients to follow these guidelines. Please only bring one care partner. Please be aware that your care partner may wait in the car in the parking lot or if they feel like they will be too hot to wait in the car, they may wait in the lobby on the 4th floor. All care partners are required to wear a mask the entire time (we do not have any that we can provide them), they need to practice social distancing, and we will do a Covid check for all patient's and care partners when you arrive. Also we will check their temperature and your temperature. If the care partner waits in their car they need to stay in the parking lot the entire time and we will call them on their cell phone when the patient is ready for discharge so they can bring the car to the front of the building. Also all patient's will need to wear a mask into building.

## 2020-03-16 ENCOUNTER — Encounter: Payer: Self-pay | Admitting: Gastroenterology

## 2020-03-27 ENCOUNTER — Ambulatory Visit (AMBULATORY_SURGERY_CENTER): Payer: Managed Care, Other (non HMO) | Admitting: Gastroenterology

## 2020-03-27 ENCOUNTER — Encounter: Payer: Self-pay | Admitting: Gastroenterology

## 2020-03-27 ENCOUNTER — Other Ambulatory Visit: Payer: Self-pay

## 2020-03-27 VITALS — BP 121/85 | HR 54 | Temp 96.9°F | Resp 15 | Ht 69.0 in | Wt 218.0 lb

## 2020-03-27 DIAGNOSIS — D120 Benign neoplasm of cecum: Secondary | ICD-10-CM | POA: Diagnosis not present

## 2020-03-27 DIAGNOSIS — Z1211 Encounter for screening for malignant neoplasm of colon: Secondary | ICD-10-CM | POA: Diagnosis not present

## 2020-03-27 DIAGNOSIS — D127 Benign neoplasm of rectosigmoid junction: Secondary | ICD-10-CM

## 2020-03-27 DIAGNOSIS — D123 Benign neoplasm of transverse colon: Secondary | ICD-10-CM

## 2020-03-27 MED ORDER — SODIUM CHLORIDE 0.9 % IV SOLN: 500.0000 mL | Freq: Once | INTRAVENOUS | Status: DC

## 2020-03-27 NOTE — Progress Notes (Signed)
A and O x3. Report to RN. Tolerated MAC anesthesia well.

## 2020-03-27 NOTE — Patient Instructions (Signed)
Please read handouts provided. Continue present medications. Await pathology results.   YOU HAD AN ENDOSCOPIC PROCEDURE TODAY AT THE [facility]:   Refer to the procedure report that was given to you for any specific questions about what was found during the examination.  If the procedure report does not answer your questions, please call your gastroenterologist to clarify.  If you requested that your care partner not be given the details of your procedure findings, then the procedure report has been included in a sealed envelope for you to review at your convenience later.  YOU SHOULD EXPECT: Some feelings of bloating in the abdomen. Passage of more gas than usual.  Walking can help get rid of the air that was put into your GI tract during the procedure and reduce the bloating. If you had a lower endoscopy (such as a colonoscopy or flexible sigmoidoscopy) you may notice spotting of blood in your stool or on the toilet paper. If you underwent a bowel prep for your procedure, you may not have a normal bowel movement for a few days.  Please Note:  You might notice some irritation and congestion in your nose or some drainage.  This is from the oxygen used during your procedure.  There is no need for concern and it should clear up in a day or so.  SYMPTOMS TO REPORT IMMEDIATELY:  Following lower endoscopy (colonoscopy or flexible sigmoidoscopy):  Excessive amounts of blood in the stool  Significant tenderness or worsening of abdominal pains  Swelling of the abdomen that is new, acute  Fever of 100F or higher   For urgent or emergent issues, a gastroenterologist can be reached at any hour by calling [phone]. Do not use MyChart messaging for urgent concerns.    DIET:  We do recommend a small meal at first, but then you may proceed to your regular diet.  Drink plenty of fluids but you should avoid alcoholic beverages for 24 hours.  ACTIVITY:  You should plan to take it easy  for the rest of today and you should NOT DRIVE or use heavy machinery until tomorrow (because of the sedation medicines used during the test).    FOLLOW UP: Our staff will call the number listed on your records 48-72 hours following your procedure to check on you and address any questions or concerns that you may have regarding the information given to you following your procedure. If we do not reach you, we will leave a message.  We will attempt to reach you two times.  During this call, we will ask if you have developed any symptoms of COVID 19. If you develop any symptoms (ie: fever, flu-like symptoms, shortness of breath, cough etc.) before then, please call [phone].  If you test positive for Covid 19 in the 2 weeks post procedure, please call and report this information to us.    If any biopsies were taken you will be contacted by phone or by letter within the next 1-3 weeks.  Please call us at [phone] if you have not heard about the biopsies in 3 weeks.    SIGNATURES/CONFIDENTIALITY: You and/or your care partner have signed paperwork which will be entered into your electronic medical record.  These signatures attest to the fact that that the information above on your After Visit Summary has been reviewed and is understood.  Full responsibility of the confidentiality of this discharge information lies with you and/or your care-partner.  

## 2020-03-27 NOTE — Op Note (Signed)
[facility] Patient Name: Seth Waters Procedure Date: 03/27/2020 10:31 AM MRN: [id number] Endoscopist: Remo Lipps P. Havery Moros , MD Age: 49 Referring MD:  Date of Birth: 11-04-1970 Gender: Male Account #: [id number] Procedure:                Colonoscopy Indications:              Screening for colorectal malignant neoplasm, This                            is the patient's first colonoscopy Medicines:                Monitored Anesthesia Care Procedure:                Pre-Anesthesia Assessment:                           - Prior to the procedure, a History and Physical                            was performed, and patient medications and                            allergies were reviewed. The patient's tolerance of                            previous anesthesia was also reviewed. The risks                            and benefits of the procedure and the sedation                            options and risks were discussed with the patient.                            All questions were answered, and informed consent                            was obtained. Prior Anticoagulants: The patient has                            taken no previous anticoagulant or antiplatelet                            agents. ASA Grade Assessment: II - A patient with                            mild systemic disease. After reviewing the risks                            and benefits, the patient was deemed in                            satisfactory condition to undergo the procedure.  After obtaining informed consent, the colonoscope                            was passed under direct vision. Throughout the                            procedure, the patient's blood pressure, pulse, and                            oxygen saturations were monitored continuously. The                            Colonoscope was introduced through the anus and                            advanced to the the  cecum, identified by                            appendiceal orifice and ileocecal valve. The                            colonoscopy was performed without difficulty. The                            patient tolerated the procedure well. The quality                            of the bowel preparation was good. The ileocecal                            valve, appendiceal orifice, and rectum were                            photographed. Scope In: 10:36:12 AM Scope Out: 10:59:05 AM Scope Withdrawal Time: 0 hours 20 minutes 51 seconds  Total Procedure Duration: 0 hours 22 minutes 53 seconds  Findings:                 The perianal and digital rectal examinations were                            normal.                           A diminutive polyp was found in the cecum. The                            polyp was sessile. The polyp was removed with a                            cold snare. Resection and retrieval were complete.                           A 5 mm polyp was found in the transverse colon. The  polyp was sessile. The polyp was removed with a                            cold snare. Resection and retrieval were complete.                           A 3 mm polyp was found in the splenic flexure. The                            polyp was sessile. The polyp was removed with a                            cold snare. Resection and retrieval were complete.                           Two sessile polyps were found in the recto-sigmoid                            colon. The polyps were 2 to 3 mm in size. These                            polyps were removed with a cold snare. Resection                            and retrieval were complete.                           A few small-mouthed diverticula were found in the                            sigmoid colon and transverse colon.                           Internal hemorrhoids were found during retroflexion.                           The  exam was otherwise without abnormality. Complications:            No immediate complications. Estimated blood loss:                            Minimal. Estimated Blood Loss:     Estimated blood loss was minimal. Impression:               - One diminutive polyp in the cecum, removed with a                            cold snare. Resected and retrieved.                           - One 5 mm polyp in the transverse colon, removed                            with a cold  snare. Resected and retrieved.                           - One 3 mm polyp at the splenic flexure, removed                            with a cold snare. Resected and retrieved.                           - Two 2 to 3 mm polyps at the recto-sigmoid colon,                            removed with a cold snare. Resected and retrieved.                           - Diverticulosis in the sigmoid colon and in the                            transverse colon.                           - Internal hemorrhoids.                           - The examination was otherwise normal. Recommendation:           - Patient has a contact number available for                            emergencies. The signs and symptoms of potential                            delayed complications were discussed with the                            patient. Return to normal activities tomorrow.                            Written discharge instructions were provided to the                            patient.                           - Resume previous diet.                           - Continue present medications.                           - Await pathology results. Remo Lipps P. Amina Menchaca, MD 03/27/2020 11:03:18 AM This report has been signed electronically.

## 2020-03-27 NOTE — Progress Notes (Signed)
Vs - SP  Front desk- MG  Pt's states no medical or surgical changes since previsit or office visit.

## 2020-03-27 NOTE — Progress Notes (Signed)
Called to room to assist during endoscopic procedure.  Patient ID and intended procedure confirmed with present staff. Received instructions for my participation in the procedure from the performing physician.  

## 2020-03-29 ENCOUNTER — Telehealth: Payer: Self-pay

## 2020-03-29 NOTE — Telephone Encounter (Signed)
°  Follow up Call-  Call back number 03/27/2020  Post procedure Call Back phone  # [phone]  Permission to leave phone message Yes  Some recent data might be hidden     Patient questions:  1. Do you have a fever, pain , or abdominal swelling? No. Have you developed a fever since your procedure? no  2.   Have you had an respiratory symptoms (SOB or cough) since your procedure? no  3.   Have you tested positive for COVID 19 since your procedure no  4.   Have you had any family members/close contacts diagnosed with the COVID 19 since your procedure?  no   If yes to any of these questions please route to Joylene John, RN and Erenest Rasher, RN Pain Score  0 *  Have you tolerated food without any problems? No.  Have you been able to return to your normal activities? No.  Do you have any questions about your discharge instructions: Diet   No. Medications  No. Follow up visit  No.  Do you have questions or concerns about your Care? No.  Actions: * If pain score is 4 or above: No action needed, pain <4.

## 2020-03-29 NOTE — Telephone Encounter (Signed)
Left message on follow up call. 

## 2020-04-04 ENCOUNTER — Encounter: Payer: Self-pay | Admitting: *Deleted

## 2020-05-19 ENCOUNTER — Other Ambulatory Visit: Payer: Self-pay | Admitting: Sports Medicine

## 2020-05-21 ENCOUNTER — Other Ambulatory Visit: Payer: Self-pay

## 2020-05-22 MED FILL — clonazePAM 0.5 MG TABS: 0.5 | 15 days supply | Qty: 30 | Fill #0

## 2020-05-22 MED FILL — ROSUVASTATIN CALCIUM 40 MG: 40 | 30 days supply | Qty: 30 | Fill #3

## 2020-06-22 ENCOUNTER — Ambulatory Visit: Payer: Managed Care, Other (non HMO) | Admitting: Sports Medicine

## 2020-08-03 MED FILL — ROSUVASTATIN CALCIUM 40 MG: 40 | 30 days supply | Qty: 30 | Fill #4

## 2020-09-13 ENCOUNTER — Other Ambulatory Visit: Payer: Self-pay | Admitting: Sports Medicine

## 2020-09-13 DIAGNOSIS — E7849 Other hyperlipidemia: Secondary | ICD-10-CM

## 2020-09-13 MED FILL — ROSUVASTATIN CALCIUM 40 MG: 40 | 30 days supply | Qty: 30 | Fill #0

## 2020-10-20 MED FILL — ROSUVASTATIN CALCIUM 40 MG: 40 | 30 days supply | Qty: 30 | Fill #1

## 2020-10-23 ENCOUNTER — Ambulatory Visit (INDEPENDENT_AMBULATORY_CARE_PROVIDER_SITE_OTHER): Payer: Managed Care, Other (non HMO) | Admitting: Sports Medicine

## 2020-10-23 ENCOUNTER — Encounter: Payer: Managed Care, Other (non HMO) | Admitting: Sports Medicine

## 2020-10-23 ENCOUNTER — Other Ambulatory Visit: Payer: Self-pay | Admitting: Sports Medicine

## 2020-10-23 ENCOUNTER — Other Ambulatory Visit: Payer: Self-pay

## 2020-10-23 ENCOUNTER — Encounter: Payer: Self-pay | Admitting: Sports Medicine

## 2020-10-23 VITALS — BP 157/79 | HR 79 | Ht 69.0 in | Wt 226.0 lb

## 2020-10-23 DIAGNOSIS — G4733 Obstructive sleep apnea (adult) (pediatric): Secondary | ICD-10-CM

## 2020-10-23 DIAGNOSIS — F419 Anxiety disorder, unspecified: Secondary | ICD-10-CM

## 2020-10-23 DIAGNOSIS — F17200 Nicotine dependence, unspecified, uncomplicated: Secondary | ICD-10-CM | POA: Diagnosis not present

## 2020-10-23 DIAGNOSIS — Z0000 Encounter for general adult medical examination without abnormal findings: Secondary | ICD-10-CM

## 2020-10-23 DIAGNOSIS — N139 Obstructive and reflux uropathy, unspecified: Secondary | ICD-10-CM

## 2020-10-23 DIAGNOSIS — E7849 Other hyperlipidemia: Secondary | ICD-10-CM | POA: Diagnosis not present

## 2020-10-23 DIAGNOSIS — F32A Depression, unspecified: Secondary | ICD-10-CM

## 2020-10-23 MED ORDER — HYDROXYZINE HCL 25 MG PO TABS: 25.0000 mg | ORAL_TABLET | Freq: Every day | ORAL | 11 refills | Status: DC

## 2020-10-23 MED ORDER — ROSUVASTATIN CALCIUM 40 MG PO TABS: 40.0000 mg | ORAL_TABLET | Freq: Every day | ORAL | 1 refills | Status: DC

## 2020-10-23 MED ORDER — CLONAZEPAM 0.5 MG PO TABS: ORAL_TABLET | ORAL | 3 refills | Status: DC

## 2020-10-23 MED ORDER — CLONAZEPAM 0.5 MG PO TABS
ORAL_TABLET | ORAL | 3 refills | Status: DC
Start: 2020-10-23 — End: 2020-10-23

## 2020-10-23 MED FILL — clonazePAM 0.5 MG TABS: 0.5 | 30 days supply | Qty: 30 | Fill #0

## 2020-10-23 MED FILL — hydrOXYzine HCL 25 MG TABS: 25 | 30 days supply | Qty: 60 | Fill #0

## 2020-10-23 NOTE — Assessment & Plan Note (Signed)
Routine physical as above, ordering routine labs, lung cancer screening CT ordered.

## 2020-10-23 NOTE — Assessment & Plan Note (Signed)
Continue Crestor, rechecking lipids

## 2020-10-23 NOTE — Assessment & Plan Note (Signed)
Doing significant better with CPAP. Adding some hydroxyzine per his request to help him sleep with the machine.

## 2020-10-23 NOTE — Assessment & Plan Note (Signed)
Relatively well-controlled, refilling clonazepam.

## 2020-10-23 NOTE — Addendum Note (Signed)
Addended by: Silverio Decamp on: 10/23/2020 03:11 PM   Modules accepted: Orders

## 2020-10-23 NOTE — Progress Notes (Addendum)
Subjective:    CC: Annual Physical Exam  HPI:  This patient is here for their annual physical  I reviewed the past medical history, family history, social history, surgical history, and allergies today and no changes were needed.  Please see the problem list section below in epic for further details.  Past Medical History: Past Medical History:  Diagnosis Date  . Anxiety    hx  . Depression    hx  . Hyperlipidemia   . Hyperlipidemia   . Hypertension    hx of-no medications   Past Surgical History: Past Surgical History:  Procedure Laterality Date  . HERNIA REPAIR     Social History: Social History   Socioeconomic History  . Marital status: Single    Spouse name: Not on file  . Number of children: Not on file  . Years of education: Not on file  . Highest education level: Not on file  Occupational History  . Not on file  Tobacco Use  . Smoking status: Former Smoker    Packs/day: 0.25    Years: 15.00    Pack years: 3.75    Types: Cigarettes  . Smokeless tobacco: Former Systems developer    Quit date: 10/11/2011  Vaping Use  . Vaping Use: Never used  Substance and Sexual Activity  . Alcohol use: Yes    Comment: occassionally  . Drug use: No  . Sexual activity: Not on file  Other Topics Concern  . Not on file  Social History Narrative  . Not on file   Social Determinants of Health   Financial Resource Strain: Not on file  Food Insecurity: Not on file  Transportation Needs: Not on file  Physical Activity: Not on file  Stress: Not on file  Social Connections: Not on file   Family History: Family History  Problem Relation Age of Onset  . Hyperlipidemia Mother   . Hypertension Mother   . Alcohol abuse Father   . Colon cancer Other        grandmother   Allergies: Allergies  Allergen Reactions  . Simvastatin Swelling   Medications: See med rec.  Review of Systems: No headache, visual changes, nausea, vomiting, diarrhea, constipation, dizziness, abdominal  pain, skin rash, fevers, chills, night sweats, swollen lymph nodes, weight loss, chest pain, body aches, joint swelling, muscle aches, shortness of breath, mood changes, visual or auditory hallucinations.  Objective:    General: Well Developed, well nourished, and in no acute distress.  Neuro: Alert and oriented x3, extra-ocular muscles intact, sensation grossly intact. Cranial nerves II through XII are intact, motor, sensory, and coordinative functions are all intact. HEENT: Normocephalic, atraumatic, pupils equal round reactive to light, neck supple, no masses, no lymphadenopathy, thyroid nonpalpable. Oropharynx, nasopharynx, external ear canals are unremarkable. Skin: Warm and dry, no rashes noted.  Cardiac: Regular rate and rhythm, no murmurs rubs or gallops.  Respiratory: Clear to auscultation bilaterally. Not using accessory muscles, speaking in full sentences.  Abdominal: Soft, nontender, nondistended, positive bowel sounds, no masses, no organomegaly.  Musculoskeletal: Shoulder, elbow, wrist, hip, knee, ankle stable, and with full range of motion.  Impression and Recommendations:    The patient was counselled, risk factors were discussed, anticipatory guidance given.  Annual physical exam Routine physical as above, ordering routine labs, lung cancer screening CT ordered.   Anxiety and depression Relatively well-controlled, refilling clonazepam.  Hyperlipidemia Continue Crestor, rechecking lipids  Smoker Greater than 20-pack-year smoking history, quit 5 years ago, ordering low-dose chest CT lung cancer screening.  No evidence of lung cancer, mild emphysema/COPD likely from historical smoking, we will continue yearly lung cancer screening CTs.  Obstructive sleep apnea Doing significant better with CPAP. Adding some hydroxyzine per his request to help him sleep with the machine.   ___________________________________________ Gwen Her. Dianah Field, M.D., ABFM.,  CAQSM. Primary Care and Sports Medicine [facility]  Adjunct Professor of [place] [facility] [employer]

## 2020-10-23 NOTE — Assessment & Plan Note (Addendum)
Greater than 20-pack-year smoking history, quit 5 years ago, ordering low-dose chest CT lung cancer screening.  No evidence of lung cancer, mild emphysema/COPD likely from historical smoking, we will continue yearly lung cancer screening CTs.

## 2020-11-02 ENCOUNTER — Other Ambulatory Visit: Payer: Self-pay

## 2020-11-02 ENCOUNTER — Ambulatory Visit (INDEPENDENT_AMBULATORY_CARE_PROVIDER_SITE_OTHER): Payer: Managed Care, Other (non HMO)

## 2020-11-02 DIAGNOSIS — Z87891 Personal history of nicotine dependence: Secondary | ICD-10-CM

## 2020-11-02 DIAGNOSIS — Z122 Encounter for screening for malignant neoplasm of respiratory organs: Secondary | ICD-10-CM | POA: Diagnosis not present

## 2020-11-03 LAB — LIPID PANEL
Cholesterol: 178 mg/dL (ref <200)
HDL: 43 mg/dL (ref >=40)
LDL Cholesterol (Calc): 115 mg/dL (calc) — ABNORMAL HIGH
Non-HDL Cholesterol (Calc): 135 mg/dL (calc) — ABNORMAL HIGH (ref <130)
Total CHOL/HDL Ratio: 4.1 (calc) (ref <5.0)
Triglycerides: 101 mg/dL (ref <150)

## 2020-11-03 LAB — COMPREHENSIVE METABOLIC PANEL
AG Ratio: 1.7 (calc) (ref 1.0–2.5)
ALT: 32 U/L (ref 9–46)
AST: 26 U/L (ref 10–35)
Albumin: 4.7 g/dL (ref 3.6–5.1)
Alkaline phosphatase (APISO): 61 U/L (ref 35–144)
BUN: 18 mg/dL (ref 7–25)
CO2: 24 mmol/L (ref 20–32)
Calcium: 10.1 mg/dL (ref 8.6–10.3)
Chloride: 106 mmol/L (ref 98–110)
Creat: 0.9 mg/dL (ref 0.70–1.33)
Globulin: 2.7 g/dL (calc) (ref 1.9–3.7)
Glucose, Bld: 100 mg/dL — ABNORMAL HIGH (ref 65–99)
Potassium: 4.9 mmol/L (ref 3.5–5.3)
Sodium: 141 mmol/L (ref 135–146)
Total Bilirubin: 0.4 mg/dL (ref 0.2–1.2)
Total Protein: 7.4 g/dL (ref 6.1–8.1)

## 2020-11-03 LAB — PSA, TOTAL AND FREE
PSA, % Free: 33 % (calc) (ref >25)
PSA, Free: 0.4 ng/mL
PSA, Total: 1.2 ng/mL (ref <=4.0)

## 2020-11-03 LAB — CBC
HCT: 47.4 % (ref 38.5–50.0)
Hemoglobin: 16.5 g/dL (ref 13.2–17.1)
MCH: 32 pg (ref 27.0–33.0)
MCHC: 34.8 g/dL (ref 32.0–36.0)
MCV: 92 fL (ref 80.0–100.0)
MPV: 10.4 fL (ref 7.5–12.5)
Platelets: 266 10*3/uL (ref 140–400)
RBC: 5.15 10*6/uL (ref 4.20–5.80)
RDW: 11.9 % (ref 11.0–15.0)
WBC: 7.5 10*3/uL (ref 3.8–10.8)

## 2020-11-03 LAB — TSH: TSH: 0.73 mIU/L (ref 0.40–4.50)

## 2020-12-06 ENCOUNTER — Other Ambulatory Visit: Payer: Self-pay | Admitting: Sports Medicine

## 2020-12-06 ENCOUNTER — Other Ambulatory Visit (HOSPITAL_BASED_OUTPATIENT_CLINIC_OR_DEPARTMENT_OTHER): Payer: Self-pay

## 2020-12-06 DIAGNOSIS — E7849 Other hyperlipidemia: Secondary | ICD-10-CM

## 2020-12-06 MED ORDER — ROSUVASTATIN CALCIUM 40 MG PO TABS
ORAL_TABLET | ORAL | 1 refills | Status: DC
  Filled 2020-12-06: qty 30, 30d supply, fill #0
  Filled 2021-01-15: qty 30, 30d supply, fill #1
  Filled 2021-03-09: qty 30, 30d supply, fill #2
  Filled 2021-05-16: qty 30, 30d supply, fill #3
  Filled 2021-08-13: qty 30, 30d supply, fill #4
  Filled 2021-08-21: qty 30, 30d supply, fill #5

## 2020-12-06 MED FILL — Clonazepam Tab 0.5 MG: ORAL | 30 days supply | Qty: 30 | Fill #0 | Status: AC

## 2020-12-06 MED FILL — Hydroxyzine HCl Tab 25 MG: ORAL | 30 days supply | Qty: 60 | Fill #0 | Status: AC

## 2020-12-26 ENCOUNTER — Other Ambulatory Visit (HOSPITAL_BASED_OUTPATIENT_CLINIC_OR_DEPARTMENT_OTHER): Payer: Self-pay

## 2021-01-15 ENCOUNTER — Other Ambulatory Visit (HOSPITAL_BASED_OUTPATIENT_CLINIC_OR_DEPARTMENT_OTHER): Payer: Self-pay

## 2021-01-15 MED FILL — Hydroxyzine HCl Tab 25 MG: ORAL | 30 days supply | Qty: 60 | Fill #1 | Status: AC

## 2021-01-15 MED FILL — Clonazepam Tab 0.5 MG: ORAL | 30 days supply | Qty: 30 | Fill #1 | Status: AC

## 2021-03-09 ENCOUNTER — Other Ambulatory Visit (HOSPITAL_BASED_OUTPATIENT_CLINIC_OR_DEPARTMENT_OTHER): Payer: Self-pay

## 2021-03-09 MED FILL — Hydroxyzine HCl Tab 25 MG: ORAL | 30 days supply | Qty: 60 | Fill #2 | Status: AC

## 2021-03-09 MED FILL — Clonazepam Tab 0.5 MG: ORAL | 30 days supply | Qty: 30 | Fill #2 | Status: AC

## 2021-05-16 ENCOUNTER — Other Ambulatory Visit: Payer: Self-pay | Admitting: Sports Medicine

## 2021-05-16 ENCOUNTER — Other Ambulatory Visit (HOSPITAL_BASED_OUTPATIENT_CLINIC_OR_DEPARTMENT_OTHER): Payer: Self-pay

## 2021-05-16 DIAGNOSIS — F419 Anxiety disorder, unspecified: Secondary | ICD-10-CM

## 2021-05-16 DIAGNOSIS — F32A Depression, unspecified: Secondary | ICD-10-CM

## 2021-05-16 MED ORDER — CLONAZEPAM 0.5 MG PO TABS
ORAL_TABLET | Freq: Every day | ORAL | 3 refills | Status: DC | PRN
  Filled 2021-05-16: qty 30, 30d supply, fill #0
  Filled 2021-08-13: qty 30, 30d supply, fill #1

## 2021-05-16 NOTE — Telephone Encounter (Signed)
Patient aware refill sent to pharmacy.

## 2021-05-16 NOTE — Telephone Encounter (Signed)
Last OV: 10/23/20 Last fill: 10/23/20 #30 refill 3

## 2021-05-24 ENCOUNTER — Other Ambulatory Visit: Payer: Self-pay

## 2021-05-24 ENCOUNTER — Ambulatory Visit (INDEPENDENT_AMBULATORY_CARE_PROVIDER_SITE_OTHER): Payer: Managed Care, Other (non HMO) | Admitting: Family Medicine

## 2021-05-24 ENCOUNTER — Other Ambulatory Visit (HOSPITAL_BASED_OUTPATIENT_CLINIC_OR_DEPARTMENT_OTHER): Payer: Self-pay

## 2021-05-24 ENCOUNTER — Encounter: Payer: Self-pay | Admitting: Family Medicine

## 2021-05-24 DIAGNOSIS — M722 Plantar fascial fibromatosis: Secondary | ICD-10-CM | POA: Diagnosis not present

## 2021-05-24 MED FILL — Hydroxyzine HCl Tab 25 MG: ORAL | 30 days supply | Qty: 60 | Fill #3 | Status: AC

## 2021-05-24 NOTE — Assessment & Plan Note (Signed)
Acute on chronic in nature.  Does get relief with conservative measures thus far. -Counseled on home exercise therapy and supportive care. -Orthotics. -Could consider injection.

## 2021-05-24 NOTE — Progress Notes (Signed)
  Seth Waters - 50 y.o. male MRN [id number]  Date of birth: 02/01/1971  SUBJECTIVE:  Including CC & ROS.  No chief complaint on file.   Seth Waters is a 50 y.o. male that is presenting with acute on chronic bilateral Planter fasciitis.  Seems to be controlled when he wears orthotics.    Review of Systems See HPI   HISTORY: Past Medical, Surgical, Social, and Family History Reviewed & Updated per EMR.   Pertinent Historical Findings include:  Past Medical History:  Diagnosis Date   Anxiety    hx   Depression    hx   Hyperlipidemia    Hyperlipidemia    Hypertension    hx of-no medications    Past Surgical History:  Procedure Laterality Date   HERNIA REPAIR      Family History  Problem Relation Age of Onset   Hyperlipidemia Mother    Hypertension Mother    Alcohol abuse Father    Colon cancer Other        grandmother    Social History   Socioeconomic History   Marital status: Single    Spouse name: Not on file   Number of children: Not on file   Years of education: Not on file   Highest education level: Not on file  Occupational History   Not on file  Tobacco Use   Smoking status: Former    Packs/day: 0.25    Years: 15.00    Pack years: 3.75    Types: Cigarettes   Smokeless tobacco: Former    Quit date: 10/11/2011  Vaping Use   Vaping Use: Never used  Substance and Sexual Activity   Alcohol use: Yes    Comment: occassionally   Drug use: No   Sexual activity: Not on file  Other Topics Concern   Not on file  Social History Narrative   Not on file   Social Determinants of Health   Financial Resource Strain: Not on file  Food Insecurity: Not on file  Transportation Needs: Not on file  Physical Activity: Not on file  Stress: Not on file  Social Connections: Not on file  Intimate Partner Violence: Not on file     PHYSICAL EXAM:  VS: Ht 5\' 9"  (1.753 m)   Wt 210 lb (95.3 kg)   BMI 31.01 kg/m  Physical Exam Gen: NAD, alert, cooperative  with exam, well-appearing   Patient was fitted for a standard, cushioned, semi-rigid orthotic. The orthotic was heated and afterward the patient stood on the orthotic blank positioned on the orthotic stand. The patient was positioned in subtalar neutral position and 10 degrees of ankle dorsiflexion in a weight bearing stance. After completion of molding, a stable base was applied to the orthotic blank. The blank was ground to a stable position for weight bearing. Size: 10 Pairs: 2 Base: Blue EVA Additional Posting and Padding: None The patient ambulated these, and they were very comfortable.      ASSESSMENT & PLAN:   Plantar fasciitis Acute on chronic in nature.  Does get relief with conservative measures thus far. -Counseled on home exercise therapy and supportive care. -Orthotics. -Could consider injection.

## 2021-08-13 ENCOUNTER — Other Ambulatory Visit (HOSPITAL_BASED_OUTPATIENT_CLINIC_OR_DEPARTMENT_OTHER): Payer: Self-pay

## 2021-08-13 MED FILL — Hydroxyzine HCl Tab 25 MG: ORAL | 30 days supply | Qty: 60 | Fill #4 | Status: AC

## 2021-08-21 ENCOUNTER — Other Ambulatory Visit (HOSPITAL_BASED_OUTPATIENT_CLINIC_OR_DEPARTMENT_OTHER): Payer: Self-pay

## 2021-08-21 MED FILL — Hydroxyzine HCl Tab 25 MG: ORAL | 30 days supply | Qty: 60 | Fill #5 | Status: CN

## 2021-09-18 ENCOUNTER — Encounter: Payer: Self-pay | Admitting: Sports Medicine

## 2021-09-18 DIAGNOSIS — E7849 Other hyperlipidemia: Secondary | ICD-10-CM

## 2021-09-18 DIAGNOSIS — G4733 Obstructive sleep apnea (adult) (pediatric): Secondary | ICD-10-CM

## 2021-09-18 DIAGNOSIS — F32A Depression, unspecified: Secondary | ICD-10-CM

## 2021-09-19 MED ORDER — CLONAZEPAM 0.5 MG PO TABS: ORAL_TABLET | Freq: Every day | ORAL | 3 refills | Status: DC | PRN

## 2021-09-19 MED ORDER — ROSUVASTATIN CALCIUM 40 MG PO TABS: ORAL_TABLET | ORAL | 1 refills | Status: DC

## 2021-09-19 MED ORDER — HYDROXYZINE HCL 25 MG PO TABS
ORAL_TABLET | Freq: Every day | ORAL | 11 refills | Status: DC
Start: 2021-09-19 — End: 2022-07-09

## 2021-11-16 ENCOUNTER — Other Ambulatory Visit (HOSPITAL_COMMUNITY): Payer: Self-pay

## 2021-12-05 ENCOUNTER — Other Ambulatory Visit (HOSPITAL_COMMUNITY): Payer: Self-pay

## 2022-04-08 ENCOUNTER — Other Ambulatory Visit: Payer: Self-pay

## 2022-07-09 ENCOUNTER — Other Ambulatory Visit (HOSPITAL_BASED_OUTPATIENT_CLINIC_OR_DEPARTMENT_OTHER): Payer: Self-pay

## 2022-07-09 ENCOUNTER — Ambulatory Visit (INDEPENDENT_AMBULATORY_CARE_PROVIDER_SITE_OTHER): Payer: Managed Care, Other (non HMO) | Admitting: Sports Medicine

## 2022-07-09 ENCOUNTER — Encounter: Payer: Self-pay | Admitting: Sports Medicine

## 2022-07-09 VITALS — BP 113/76 | HR 68

## 2022-07-09 DIAGNOSIS — E7800 Pure hypercholesterolemia, unspecified: Secondary | ICD-10-CM

## 2022-07-09 DIAGNOSIS — Z23 Encounter for immunization: Secondary | ICD-10-CM

## 2022-07-09 DIAGNOSIS — F32A Depression, unspecified: Secondary | ICD-10-CM

## 2022-07-09 DIAGNOSIS — N139 Obstructive and reflux uropathy, unspecified: Secondary | ICD-10-CM

## 2022-07-09 DIAGNOSIS — Z0000 Encounter for general adult medical examination without abnormal findings: Secondary | ICD-10-CM

## 2022-07-09 DIAGNOSIS — F419 Anxiety disorder, unspecified: Secondary | ICD-10-CM

## 2022-07-09 DIAGNOSIS — G4733 Obstructive sleep apnea (adult) (pediatric): Secondary | ICD-10-CM

## 2022-07-09 DIAGNOSIS — E7849 Other hyperlipidemia: Secondary | ICD-10-CM

## 2022-07-09 MED ORDER — ROSUVASTATIN CALCIUM 40 MG PO TABS
40.0000 mg | ORAL_TABLET | Freq: Every day | ORAL | 3 refills | Status: DC
  Filled 2022-07-09: qty 90, 90d supply, fill #0
  Filled 2022-10-21 (×2): qty 90, 90d supply, fill #1
  Filled 2023-01-10: qty 90, 90d supply, fill #2
  Filled 2023-05-01: qty 90, 90d supply, fill #0

## 2022-07-09 MED ORDER — HYDROXYZINE HCL 25 MG PO TABS
25.0000 mg | ORAL_TABLET | Freq: Every day | ORAL | 11 refills | Status: DC
  Filled 2022-07-09: qty 60, 30d supply, fill #0
  Filled 2022-08-20: qty 60, 30d supply, fill #1
  Filled 2022-10-21 (×2): qty 60, 30d supply, fill #2
  Filled 2023-01-10: qty 60, 30d supply, fill #3
  Filled 2023-02-28: qty 60, 30d supply, fill #4
  Filled 2023-05-01: qty 60, 30d supply, fill #0
  Filled 2023-06-02: qty 60, 30d supply, fill #1
  Filled 2023-07-09: qty 60, 30d supply, fill #2

## 2022-07-09 MED ORDER — CLONAZEPAM 0.5 MG PO TABS
0.5000 mg | ORAL_TABLET | Freq: Every day | ORAL | 3 refills | Status: DC | PRN
  Filled 2022-07-09: qty 30, 30d supply, fill #0
  Filled 2022-08-20: qty 30, 30d supply, fill #1
  Filled 2022-10-21: qty 30, 30d supply, fill #2

## 2022-07-09 NOTE — Assessment & Plan Note (Signed)
Annual physical as above, up-to-date on flu, Shingrix No. 1 today, #2 in 2 months and a nurse visit.

## 2022-07-09 NOTE — Addendum Note (Signed)
Addended by: Tarri Glenn A on: 07/09/2022 04:29 PM   Modules accepted: Orders

## 2022-07-09 NOTE — Progress Notes (Signed)
Subjective:    CC: Annual Physical Exam  HPI:  This patient is here for their annual physical  I reviewed the past medical history, family history, social history, surgical history, and allergies today and no changes were needed.  Please see the problem list section below in epic for further details.  Past Medical History: Past Medical History:  Diagnosis Date   Anxiety    hx   Depression    hx   Hyperlipidemia    Hyperlipidemia    Hypertension    hx of-no medications   Past Surgical History: Past Surgical History:  Procedure Laterality Date   HERNIA REPAIR     Social History: Social History   Socioeconomic History   Marital status: Single    Spouse name: Not on file   Number of children: Not on file   Years of education: Not on file   Highest education level: Not on file  Occupational History   Not on file  Tobacco Use   Smoking status: Former    Packs/day: 0.25    Years: 15.00    Total pack years: 3.75    Types: Cigarettes   Smokeless tobacco: Former    Quit date: 10/11/2011  Vaping Use   Vaping Use: Never used  Substance and Sexual Activity   Alcohol use: Yes    Comment: occassionally   Drug use: No   Sexual activity: Not on file  Other Topics Concern   Not on file  Social History Narrative   Not on file   Social Determinants of Health   Financial Resource Strain: Not on file  Food Insecurity: Not on file  Transportation Needs: Not on file  Physical Activity: Not on file  Stress: Not on file  Social Connections: Not on file   Family History: Family History  Problem Relation Age of Onset   Hyperlipidemia Mother    Hypertension Mother    Alcohol abuse Father    Colon cancer Other        grandmother   Allergies: Allergies  Allergen Reactions   Simvastatin Swelling   Medications: See med rec.  Review of Systems: No headache, visual changes, nausea, vomiting, diarrhea, constipation, dizziness, abdominal pain, skin rash, fevers, chills,  night sweats, swollen lymph nodes, weight loss, chest pain, body aches, joint swelling, muscle aches, shortness of breath, mood changes, visual or auditory hallucinations.  Objective:    General: Well Developed, well nourished, and in no acute distress.  Neuro: Alert and oriented x3, extra-ocular muscles intact, sensation grossly intact. Cranial nerves II through XII are intact, motor, sensory, and coordinative functions are all intact. HEENT: Normocephalic, atraumatic, pupils equal round reactive to light, neck supple, no masses, no lymphadenopathy, thyroid nonpalpable. Oropharynx, nasopharynx, external ear canals are unremarkable. Skin: Warm and dry, no rashes noted.  Cardiac: Regular rate and rhythm, no murmurs rubs or gallops.  Respiratory: Clear to auscultation bilaterally. Not using accessory muscles, speaking in full sentences.  Abdominal: Soft, nontender, nondistended, positive bowel sounds, no masses, no organomegaly.  Musculoskeletal: Shoulder, elbow, wrist, hip, knee, ankle stable, and with full range of motion.  Impression and Recommendations:    The patient was counselled, risk factors were discussed, anticipatory guidance given.  Annual physical exam Annual physical as above, up-to-date on flu, Shingrix No. 1 today, #2 in 2 months and a nurse visit.   ____________________________________________ Gwen Her. Dianah Field, M.D., ABFM., CAQSM., AME. Primary Care and Sports Medicine [facility]  Adjunct Professor of [place]  of [employer] of Medicine  Risk manager

## 2022-07-10 ENCOUNTER — Other Ambulatory Visit (HOSPITAL_BASED_OUTPATIENT_CLINIC_OR_DEPARTMENT_OTHER): Payer: Self-pay

## 2022-07-12 LAB — COMPLETE METABOLIC PANEL WITH GFR
AG Ratio: 1.7 (calc) (ref 1.0–2.5)
ALT: 31 U/L (ref 9–46)
AST: 28 U/L (ref 10–35)
Albumin: 4.3 g/dL (ref 3.6–5.1)
Alkaline phosphatase (APISO): 56 U/L (ref 35–144)
BUN/Creatinine Ratio: 33 (calc) — ABNORMAL HIGH (ref 6–22)
BUN: 29 mg/dL — ABNORMAL HIGH (ref 7–25)
CO2: 27 mmol/L (ref 20–32)
Calcium: 9.9 mg/dL (ref 8.6–10.3)
Chloride: 103 mmol/L (ref 98–110)
Creat: 0.87 mg/dL (ref 0.70–1.30)
Globulin: 2.6 g/dL (calc) (ref 1.9–3.7)
Glucose, Bld: 106 mg/dL — ABNORMAL HIGH (ref 65–99)
Potassium: 4.9 mmol/L (ref 3.5–5.3)
Sodium: 137 mmol/L (ref 135–146)
Total Bilirubin: 0.6 mg/dL (ref 0.2–1.2)
Total Protein: 6.9 g/dL (ref 6.1–8.1)
eGFR: 104 mL/min/{1.73_m2} (ref >=60)

## 2022-07-12 LAB — HEMOGLOBIN A1C
Hgb A1c MFr Bld: 5.6 % of total Hgb (ref <5.7)
Mean Plasma Glucose: 114 mg/dL
eAG (mmol/L): 6.3 mmol/L

## 2022-07-12 LAB — CBC
HCT: 42.6 % (ref 38.5–50.0)
Hemoglobin: 14.7 g/dL (ref 13.2–17.1)
MCH: 31.3 pg (ref 27.0–33.0)
MCHC: 34.5 g/dL (ref 32.0–36.0)
MCV: 90.8 fL (ref 80.0–100.0)
MPV: 11 fL (ref 7.5–12.5)
Platelets: 240 10*3/uL (ref 140–400)
RBC: 4.69 10*6/uL (ref 4.20–5.80)
RDW: 12.6 % (ref 11.0–15.0)
WBC: 10.9 10*3/uL — ABNORMAL HIGH (ref 3.8–10.8)

## 2022-07-12 LAB — PSA, TOTAL AND FREE
PSA, % Free: 20 % (calc) — ABNORMAL LOW (ref >25)
PSA, Free: 0.2 ng/mL
PSA, Total: 1 ng/mL (ref <=4.0)

## 2022-07-12 LAB — TSH: TSH: 0.49 mIU/L (ref 0.40–4.50)

## 2022-07-12 LAB — LIPID PANEL
Cholesterol: 205 mg/dL — ABNORMAL HIGH (ref <200)
HDL: 50 mg/dL (ref >=40)
LDL Cholesterol (Calc): 130 mg/dL (calc) — ABNORMAL HIGH
Non-HDL Cholesterol (Calc): 155 mg/dL (calc) — ABNORMAL HIGH (ref <130)
Total CHOL/HDL Ratio: 4.1 (calc) (ref <5.0)
Triglycerides: 137 mg/dL (ref <150)

## 2022-08-20 ENCOUNTER — Other Ambulatory Visit (HOSPITAL_BASED_OUTPATIENT_CLINIC_OR_DEPARTMENT_OTHER): Payer: Self-pay

## 2022-09-02 ENCOUNTER — Other Ambulatory Visit (HOSPITAL_BASED_OUTPATIENT_CLINIC_OR_DEPARTMENT_OTHER): Payer: Self-pay

## 2022-10-20 IMAGING — CT CT CHEST LUNG CANCER SCREENING LOW DOSE W/O CM
1 series · 10 of 10 positions shown, 13 images · non-contrast
Comparison: No priors.

CLINICAL DATA: 50-year-old male former smoker (quit 9 months ago)
with 30 pack-year history of smoking. Lung cancer screening
examination.

EXAM:
CT CHEST WITHOUT CONTRAST LOW-DOSE FOR LUNG CANCER SCREENING
TECHNIQUE: Multidetector CT imaging of the chest was performed following the
standard protocol without IV contrast.

[ct lung segmentation data · axial · 0.75mm/px · z∈[-329,-329]mm · 10 of 287 frames shown]
[frame 1/287  mediastinal]
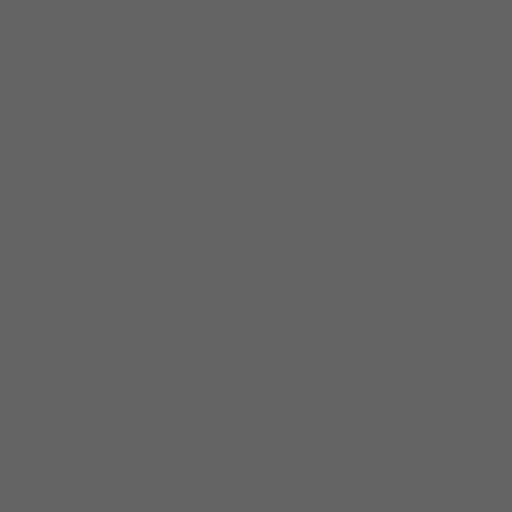
[frame 1/287  lung]
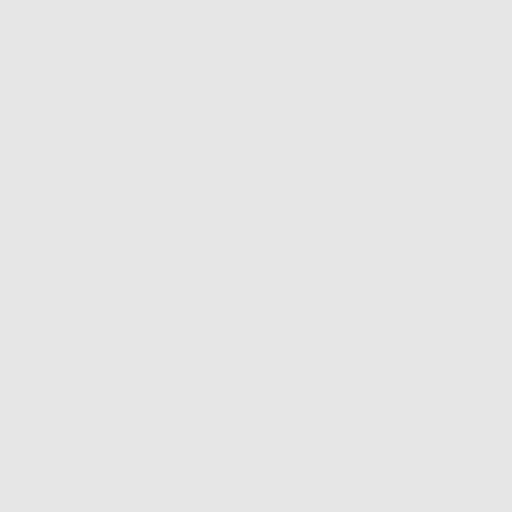
[frame 32/287  lung]
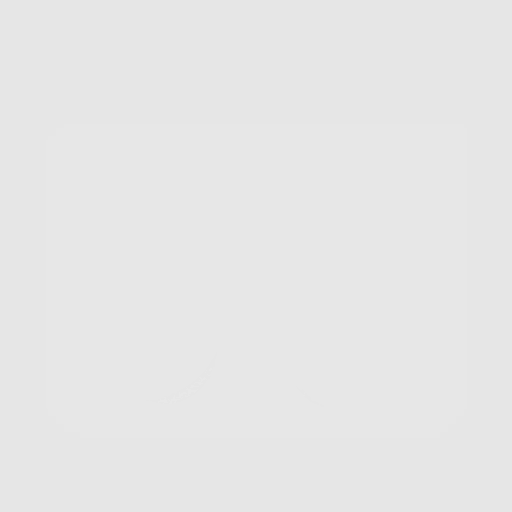
[frame 64/287  lung]
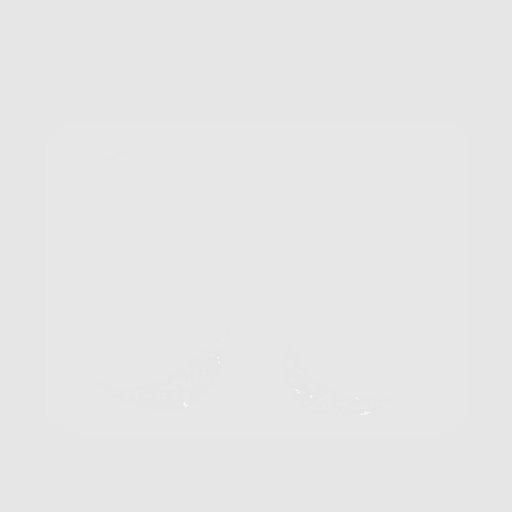
[frame 96/287  lung]
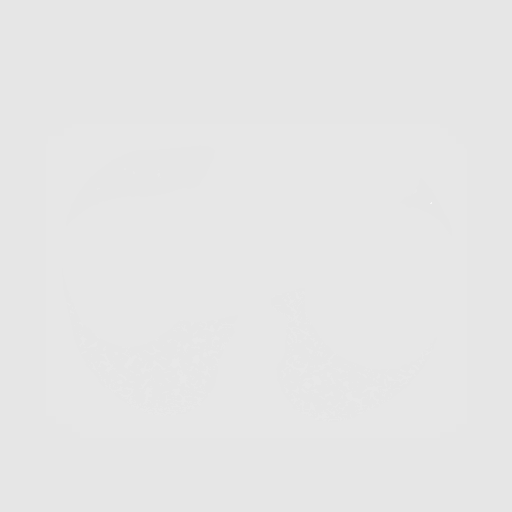
[frame 128/287  mediastinal]
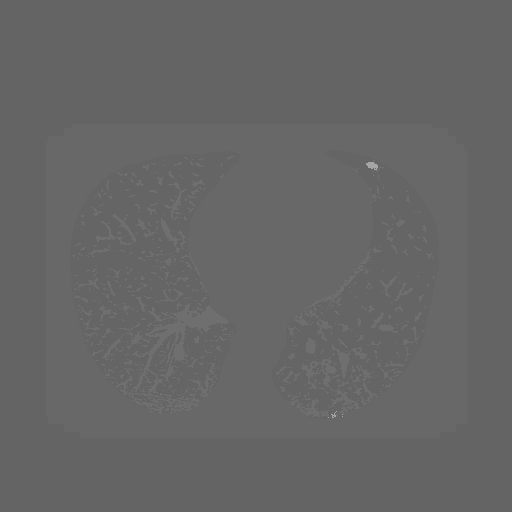
[frame 128/287  lung]
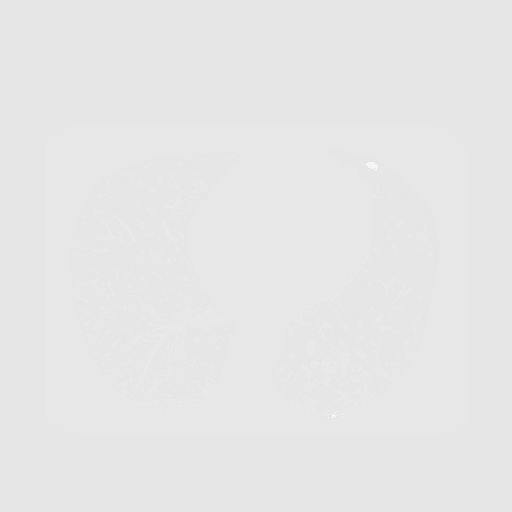
[frame 159/287  lung]
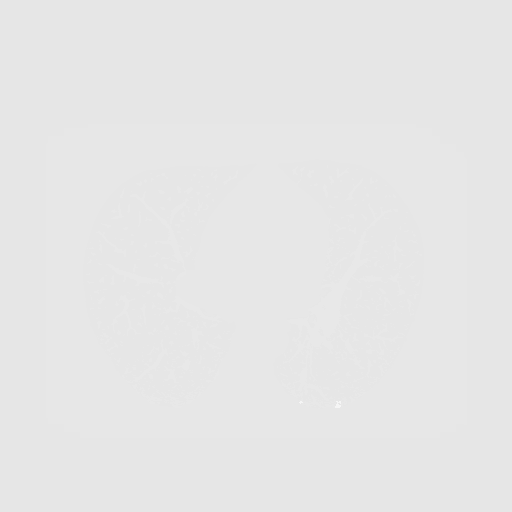
[frame 191/287  lung]
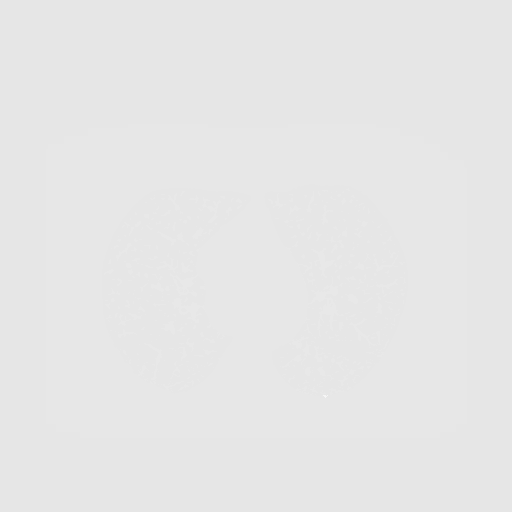
[frame 223/287  lung]
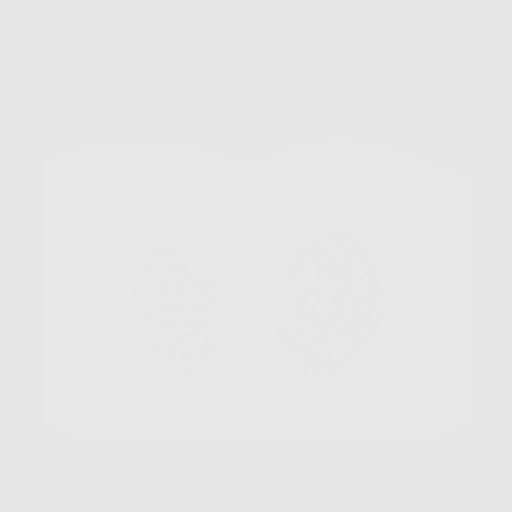
[frame 255/287  mediastinal]
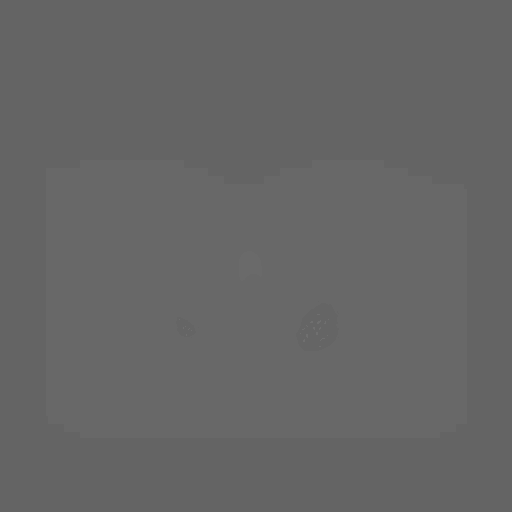
[frame 255/287  lung]
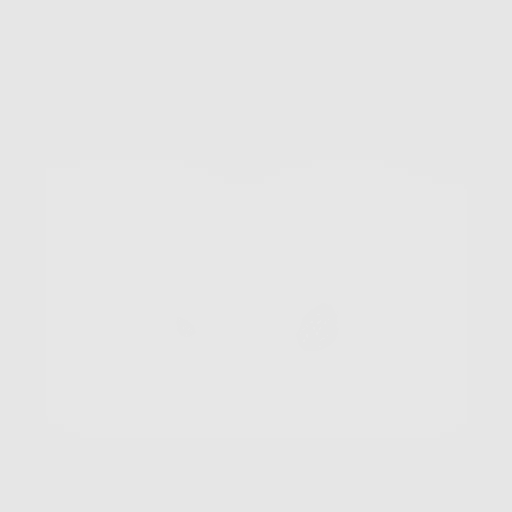
[frame 287/287  lung]
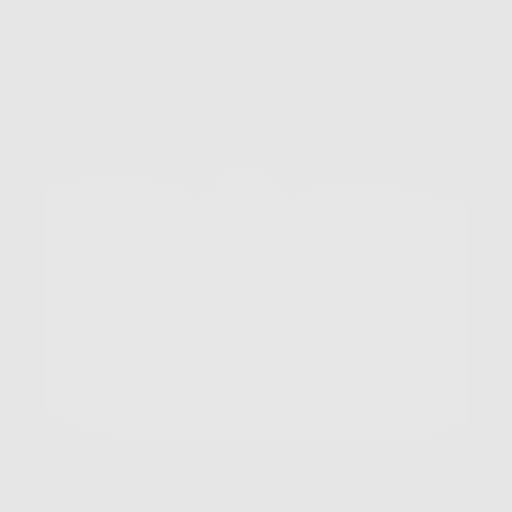

[10 of 10 positions shown; findings below may reference images not displayed]

FINDINGS: Cardiovascular: Heart size is normal. There is no significant
pericardial fluid, thickening or pericardial calcification. No
atherosclerotic calcifications in the thoracic aorta or the coronary
arteries.

Mediastinum/Nodes: No pathologically enlarged mediastinal or hilar
lymph nodes. Please note that accurate exclusion of hilar adenopathy
is limited on noncontrast CT scans. Esophagus is unremarkable in
appearance. No axillary lymphadenopathy.

Lungs/Pleura: Small left lower lobe pulmonary nodule abutting the
major fissure (axial image 150 of series 3), with a volume derived
mean diameter of 4.3 mm. No other larger more suspicious appearing
pulmonary nodules or masses are noted. No acute consolidative
airspace disease. No pleural effusions. Mild diffuse bronchial wall
thickening with very mild centrilobular and paraseptal emphysema.

Upper Abdomen: 1.5 cm low-attenuation lesion (0 HU) in the right
adrenal gland, compatible with a benign adenoma.

Musculoskeletal: There are no aggressive appearing lytic or blastic
lesions noted in the visualized portions of the skeleton.
IMPRESSION: 1. Lung-RADS 2, benign appearance or behavior. Continue annual
screening with low-dose chest CT without contrast in 12 months.
2. Mild diffuse bronchial wall thickening with very mild
centrilobular and paraseptal emphysema; imaging findings suggestive
of underlying COPD.
3. Small right adrenal adenoma.

Emphysema ([id number]).

## 2022-10-21 ENCOUNTER — Other Ambulatory Visit: Payer: Self-pay

## 2022-10-21 ENCOUNTER — Other Ambulatory Visit (HOSPITAL_BASED_OUTPATIENT_CLINIC_OR_DEPARTMENT_OTHER): Payer: Self-pay

## 2022-12-09 ENCOUNTER — Encounter: Payer: Self-pay | Admitting: *Deleted

## 2023-01-10 ENCOUNTER — Other Ambulatory Visit: Payer: Self-pay

## 2023-01-10 ENCOUNTER — Other Ambulatory Visit: Payer: Self-pay | Admitting: Sports Medicine

## 2023-01-10 ENCOUNTER — Other Ambulatory Visit (HOSPITAL_BASED_OUTPATIENT_CLINIC_OR_DEPARTMENT_OTHER): Payer: Self-pay

## 2023-01-10 DIAGNOSIS — F32A Depression, unspecified: Secondary | ICD-10-CM

## 2023-01-10 MED ORDER — CLONAZEPAM 0.5 MG PO TABS
0.5000 mg | ORAL_TABLET | Freq: Every day | ORAL | 3 refills | Status: DC | PRN
  Filled 2023-01-10: qty 30, 30d supply, fill #0
  Filled 2023-02-28: qty 30, 30d supply, fill #1
  Filled 2023-05-01: qty 30, 30d supply, fill #0
  Filled 2023-06-02: qty 30, 30d supply, fill #1

## 2023-01-10 NOTE — Telephone Encounter (Signed)
Pt called. He is requesting refill on his [employer].

## 2023-02-28 ENCOUNTER — Other Ambulatory Visit: Payer: Self-pay

## 2023-02-28 ENCOUNTER — Other Ambulatory Visit (HOSPITAL_BASED_OUTPATIENT_CLINIC_OR_DEPARTMENT_OTHER): Payer: Self-pay

## 2023-03-09 ENCOUNTER — Encounter (INDEPENDENT_AMBULATORY_CARE_PROVIDER_SITE_OTHER): Payer: Self-pay | Admitting: Sports Medicine

## 2023-03-09 DIAGNOSIS — F5101 Primary insomnia: Secondary | ICD-10-CM

## 2023-03-10 ENCOUNTER — Other Ambulatory Visit (HOSPITAL_COMMUNITY): Payer: Self-pay

## 2023-03-10 ENCOUNTER — Telehealth: Payer: Self-pay | Admitting: Sports Medicine

## 2023-03-10 ENCOUNTER — Other Ambulatory Visit (HOSPITAL_BASED_OUTPATIENT_CLINIC_OR_DEPARTMENT_OTHER): Payer: Self-pay

## 2023-03-10 DIAGNOSIS — F5101 Primary insomnia: Secondary | ICD-10-CM | POA: Insufficient documentation

## 2023-03-10 MED ORDER — QUVIVIQ 25 MG PO TABS
1.0000 | ORAL_TABLET | Freq: Every day | ORAL | 3 refills | Status: DC
  Filled 2023-03-10: qty 30, 30d supply, fill #0

## 2023-03-10 NOTE — Telephone Encounter (Signed)
Insurance rep with [employer] called on behalf of patient.   Seth Waters is not a preferred med. Can we check to see if there are other meds that might cost less? If not patient will go ahead with PA on [place].

## 2023-03-10 NOTE — Telephone Encounter (Signed)
Please see the MyChart message reply(ies) for my assessment and plan.    This patient gave consent for this Medical Advice Message and is aware that it may result in a bill to [employer], as well as the possibility of receiving a bill for a co-payment or deductible. They are an established patient, but are not seeking medical advice exclusively about a problem treated during an in person or video visit in the last seven days. I did not recommend an in person or video visit within seven days of my reply.    I spent a total of 6 minutes cumulative time within 7 days through [employer].  Rodney Langton, MD

## 2023-03-10 NOTE — Telephone Encounter (Signed)
Frankly there are many options that need to be tried first, he specifically asked for this medication, so I sent it in but I knew it would not work out.  If he is having insomnia we need to start with things like sleep hygiene, he needs an appointment or at least a virtual visit to discuss the next best steps, simply throwing sleep medication at this may not be the right answer.

## 2023-03-28 ENCOUNTER — Telehealth (INDEPENDENT_AMBULATORY_CARE_PROVIDER_SITE_OTHER): Payer: Self-pay | Admitting: Otolaryngology

## 2023-03-28 ENCOUNTER — Other Ambulatory Visit (HOSPITAL_COMMUNITY): Payer: Self-pay

## 2023-03-28 NOTE — Telephone Encounter (Signed)
Called pt and left vmail to call the off for Consult for inspire

## 2023-05-01 ENCOUNTER — Other Ambulatory Visit (HOSPITAL_BASED_OUTPATIENT_CLINIC_OR_DEPARTMENT_OTHER): Payer: Self-pay

## 2023-06-02 ENCOUNTER — Other Ambulatory Visit (HOSPITAL_BASED_OUTPATIENT_CLINIC_OR_DEPARTMENT_OTHER): Payer: Self-pay

## 2023-06-02 MED ORDER — SOD FLUORIDE-POTASSIUM NITRATE 1.1-5 % DT GEL
DENTAL | 3 refills | Status: AC
  Filled 2023-06-02: qty 100, 30d supply, fill #0
  Filled 2023-07-09 – 2023-07-22 (×2): qty 100, 30d supply, fill #1
  Filled 2023-09-13 – 2023-10-11 (×3): qty 100, 30d supply, fill #2

## 2023-06-03 ENCOUNTER — Other Ambulatory Visit: Payer: Self-pay

## 2023-06-03 ENCOUNTER — Other Ambulatory Visit (HOSPITAL_BASED_OUTPATIENT_CLINIC_OR_DEPARTMENT_OTHER): Payer: Self-pay

## 2023-06-11 ENCOUNTER — Other Ambulatory Visit (HOSPITAL_BASED_OUTPATIENT_CLINIC_OR_DEPARTMENT_OTHER): Payer: Self-pay

## 2023-07-09 ENCOUNTER — Encounter: Payer: Self-pay | Admitting: Sports Medicine

## 2023-07-09 ENCOUNTER — Other Ambulatory Visit (HOSPITAL_BASED_OUTPATIENT_CLINIC_OR_DEPARTMENT_OTHER): Payer: Self-pay

## 2023-07-09 ENCOUNTER — Ambulatory Visit (INDEPENDENT_AMBULATORY_CARE_PROVIDER_SITE_OTHER): Payer: BC Managed Care – PPO | Admitting: Sports Medicine

## 2023-07-09 ENCOUNTER — Other Ambulatory Visit (HOSPITAL_COMMUNITY): Payer: Self-pay

## 2023-07-09 VITALS — BP 129/75 | HR 57 | Ht 69.0 in | Wt 224.0 lb

## 2023-07-09 DIAGNOSIS — L649 Androgenic alopecia, unspecified: Secondary | ICD-10-CM

## 2023-07-09 DIAGNOSIS — Z0000 Encounter for general adult medical examination without abnormal findings: Secondary | ICD-10-CM

## 2023-07-09 DIAGNOSIS — F5101 Primary insomnia: Secondary | ICD-10-CM

## 2023-07-09 DIAGNOSIS — N139 Obstructive and reflux uropathy, unspecified: Secondary | ICD-10-CM

## 2023-07-09 DIAGNOSIS — F32A Depression, unspecified: Secondary | ICD-10-CM

## 2023-07-09 DIAGNOSIS — F419 Anxiety disorder, unspecified: Secondary | ICD-10-CM

## 2023-07-09 DIAGNOSIS — E7800 Pure hypercholesterolemia, unspecified: Secondary | ICD-10-CM | POA: Diagnosis not present

## 2023-07-09 DIAGNOSIS — G4733 Obstructive sleep apnea (adult) (pediatric): Secondary | ICD-10-CM | POA: Diagnosis not present

## 2023-07-09 DIAGNOSIS — E669 Obesity, unspecified: Secondary | ICD-10-CM

## 2023-07-09 DIAGNOSIS — E7849 Other hyperlipidemia: Secondary | ICD-10-CM

## 2023-07-09 MED ORDER — MINOXIDIL 5 % EX FOAM
1.0000 | Freq: Two times a day (BID) | CUTANEOUS | 11 refills | Status: AC
  Filled 2023-07-09: qty 60, 30d supply, fill #0

## 2023-07-09 MED ORDER — CLONAZEPAM 0.5 MG PO TABS
0.5000 mg | ORAL_TABLET | Freq: Every day | ORAL | 3 refills | Status: DC | PRN
  Filled 2023-07-09 (×2): qty 30, 30d supply, fill #0
  Filled 2023-08-06: qty 30, 30d supply, fill #1
  Filled 2023-09-13: qty 30, 30d supply, fill #2
  Filled 2023-10-11: qty 30, 30d supply, fill #3

## 2023-07-09 MED ORDER — ROSUVASTATIN CALCIUM 40 MG PO TABS
40.0000 mg | ORAL_TABLET | Freq: Every day | ORAL | 3 refills | Status: DC
  Filled 2023-07-09 – 2023-08-19 (×2): qty 90, 90d supply, fill #0
  Filled 2023-11-04: qty 90, 90d supply, fill #1
  Filled 2024-02-02 – 2024-02-28 (×2): qty 90, 90d supply, fill #2
  Filled 2024-06-11: qty 90, 90d supply, fill #3
  Filled ????-??-??: fill #0

## 2023-07-09 NOTE — Assessment & Plan Note (Signed)
Rechecking lab including lipids, currently on Crestor 40, goal LDL less than 100.

## 2023-07-09 NOTE — Assessment & Plan Note (Signed)
Obesity with multiple comorbidities, he will work on diet and exercise with a goal to get into with 190 pound range by 6 months if it he does not hit this target we can add a GLP-1.

## 2023-07-09 NOTE — Assessment & Plan Note (Signed)
Receding hairline, balding of the crown, we discussed modalities including Propecia and Rogaine. Adding Rogaine foam per his request, he understands this takes a long time. If insufficient proven after 6 to 12 months we can consider the addition of Propecia.

## 2023-07-09 NOTE — Assessment & Plan Note (Signed)
Fasting annual physical as above, he did get Shingrix #2 about 6 months after the first 1. Update on flu. Return to see me in 1 year for this.

## 2023-07-09 NOTE — Progress Notes (Signed)
Subjective:    CC: Annual Physical Exam  HPI:  This patient is here for their annual physical  I reviewed the past medical history, family history, social history, surgical history, and allergies today and no changes were needed.  Please see the problem list section below in epic for further details.  Past Medical History: Past Medical History:  Diagnosis Date   Anxiety    hx   Depression    hx   Hyperlipidemia    Hyperlipidemia    Hypertension    hx of-no medications   Past Surgical History: Past Surgical History:  Procedure Laterality Date   HERNIA REPAIR     Social History: Social History   Socioeconomic History   Marital status: Single    Spouse name: Not on file   Number of children: Not on file   Years of education: Not on file   Highest education level: Not on file  Occupational History   Not on file  Tobacco Use   Smoking status: Former    Current packs/day: 0.25    Average packs/day: 0.3 packs/day for 15.0 years (3.8 ttl pk-yrs)    Types: Cigarettes   Smokeless tobacco: Former    Quit date: 10/11/2011  Vaping Use   Vaping status: Never Used  Substance and Sexual Activity   Alcohol use: Yes    Comment: occassionally   Drug use: No   Sexual activity: Not on file  Other Topics Concern   Not on file  Social History Narrative   Not on file   Social Determinants of Health   Financial Resource Strain: Not on file  Food Insecurity: No Food Insecurity (01/23/2021)   [facility]   Hunger Vital Sign    Worried About Running Out of Food in the Last Year: Never true    Ran Out of Food in the Last Year: Never true  Transportation Needs: Not on file  Physical Activity: Not on file  Stress: Not on file  Social Connections: Unknown (01/07/2022)   [facility] from [facility], [facility]   Social Network    Social Network: Not on file   Family History: Family History  Problem Relation Age of Onset   Hyperlipidemia  Mother    Hypertension Mother    Alcohol abuse Father    Colon cancer Other        grandmother   Allergies: Allergies  Allergen Reactions   Simvastatin Swelling   Medications: See med rec.  Review of Systems: No headache, visual changes, nausea, vomiting, diarrhea, constipation, dizziness, abdominal pain, skin rash, fevers, chills, night sweats, swollen lymph nodes, weight loss, chest pain, body aches, joint swelling, muscle aches, shortness of breath, mood changes, visual or auditory hallucinations.  Objective:    General: Well Developed, well nourished, and in no acute distress.  Neuro: Alert and oriented x3, extra-ocular muscles intact, sensation grossly intact. Cranial nerves II through XII are intact, motor, sensory, and coordinative functions are all intact. HEENT: Normocephalic, atraumatic, pupils equal round reactive to light, neck supple, no masses, no lymphadenopathy, thyroid nonpalpable. Oropharynx, nasopharynx, external ear canals are unremarkable. Skin: Warm and dry, no rashes noted.  Cardiac: Regular rate and rhythm, no murmurs rubs or gallops.  Respiratory: Clear to auscultation bilaterally. Not using accessory muscles, speaking in full sentences.  Abdominal: Soft, nontender, nondistended, positive bowel sounds, no masses, no organomegaly.  Musculoskeletal: Shoulder, elbow, wrist, hip, knee, ankle stable, and with full range of motion.  Impression and Recommendations:  The patient was counselled, risk factors were discussed, anticipatory guidance given.  Annual physical exam Fasting annual physical as above, he did get Shingrix #2 about 6 months after the first 1. Update on flu. Return to see me in 1 year for this.  Hyperlipidemia Rechecking lab including lipids, currently on Crestor 40, goal LDL less than 100.  Obstructive sleep apnea Well-controlled with CPAP, we did discuss the inspire device and decided this was not right for him.  Primary insomnia Doing  well, has not needed his sleeping pill Quviviq, we will discontinue this.  Androgenic alopecia Receding hairline, balding of the crown, we discussed modalities including Propecia and Rogaine. Adding Rogaine foam per his request, he understands this takes a long time. If insufficient proven after 6 to 12 months we can consider the addition of Propecia.  Obesity (BMI 30-39.9) Obesity with multiple comorbidities, he will work on diet and exercise with a goal to get into with 190 pound range by 6 months if it he does not hit this target we can add a GLP-1.   ____________________________________________ Ihor Austin. Benjamin Stain, M.D., ABFM., CAQSM., AME. Primary Care and Sports Medicine [facility]  Adjunct Professor of Family Medicine  [place] [employer] [facility] [employer]  Restaurant manager, fast food

## 2023-07-09 NOTE — Assessment & Plan Note (Signed)
Well-controlled with CPAP, we did discuss the inspire device and decided this was not right for him.

## 2023-07-09 NOTE — Assessment & Plan Note (Signed)
Doing well, has not needed his sleeping pill Quviviq, we will discontinue this.

## 2023-07-14 ENCOUNTER — Encounter: Payer: Self-pay | Admitting: Sports Medicine

## 2023-07-21 ENCOUNTER — Other Ambulatory Visit (HOSPITAL_BASED_OUTPATIENT_CLINIC_OR_DEPARTMENT_OTHER): Payer: Self-pay

## 2023-07-22 ENCOUNTER — Other Ambulatory Visit (HOSPITAL_COMMUNITY): Payer: Self-pay

## 2023-07-22 ENCOUNTER — Other Ambulatory Visit (HOSPITAL_BASED_OUTPATIENT_CLINIC_OR_DEPARTMENT_OTHER): Payer: Self-pay

## 2023-07-25 ENCOUNTER — Other Ambulatory Visit (HOSPITAL_BASED_OUTPATIENT_CLINIC_OR_DEPARTMENT_OTHER): Payer: Self-pay

## 2023-08-01 ENCOUNTER — Other Ambulatory Visit (HOSPITAL_BASED_OUTPATIENT_CLINIC_OR_DEPARTMENT_OTHER): Payer: Self-pay

## 2023-08-01 ENCOUNTER — Other Ambulatory Visit: Payer: Self-pay | Admitting: Sports Medicine

## 2023-08-01 DIAGNOSIS — G4733 Obstructive sleep apnea (adult) (pediatric): Secondary | ICD-10-CM

## 2023-08-01 MED ORDER — HYDROXYZINE HCL 25 MG PO TABS
25.0000 mg | ORAL_TABLET | Freq: Every day | ORAL | 11 refills | Status: DC
  Filled 2023-08-01: qty 60, 30d supply, fill #0
  Filled 2023-09-13: qty 60, 30d supply, fill #1
  Filled 2023-10-11: qty 60, 30d supply, fill #2
  Filled 2023-11-04 (×2): qty 60, 30d supply, fill #3
  Filled 2023-12-20: qty 60, 30d supply, fill #4
  Filled 2024-01-12 (×2): qty 60, 30d supply, fill #5
  Filled 2024-02-02: qty 60, 30d supply, fill #6
  Filled 2024-02-28: qty 60, 30d supply, fill #7
  Filled 2024-04-10: qty 60, 30d supply, fill #8
  Filled 2024-05-17: qty 60, 30d supply, fill #9
  Filled 2024-06-11: qty 60, 30d supply, fill #10
  Filled 2024-07-24: qty 60, 30d supply, fill #11

## 2023-08-03 ENCOUNTER — Encounter (INDEPENDENT_AMBULATORY_CARE_PROVIDER_SITE_OTHER): Payer: Self-pay | Admitting: Sports Medicine

## 2023-08-03 DIAGNOSIS — E669 Obesity, unspecified: Secondary | ICD-10-CM

## 2023-08-04 NOTE — Telephone Encounter (Signed)

## 2023-08-06 ENCOUNTER — Other Ambulatory Visit (HOSPITAL_BASED_OUTPATIENT_CLINIC_OR_DEPARTMENT_OTHER): Payer: Self-pay

## 2023-08-06 ENCOUNTER — Other Ambulatory Visit: Payer: Self-pay

## 2023-08-19 ENCOUNTER — Other Ambulatory Visit: Payer: Self-pay

## 2023-08-19 ENCOUNTER — Other Ambulatory Visit (HOSPITAL_BASED_OUTPATIENT_CLINIC_OR_DEPARTMENT_OTHER): Payer: Self-pay

## 2023-09-10 DIAGNOSIS — N139 Obstructive and reflux uropathy, unspecified: Secondary | ICD-10-CM | POA: Diagnosis not present

## 2023-09-10 DIAGNOSIS — E7800 Pure hypercholesterolemia, unspecified: Secondary | ICD-10-CM | POA: Diagnosis not present

## 2023-09-11 LAB — COMPREHENSIVE METABOLIC PANEL
ALT: 45 [IU]/L — ABNORMAL HIGH (ref 0–44)
AST: 38 [IU]/L (ref 0–40)
Albumin: 4.6 g/dL (ref 3.8–4.9)
Alkaline Phosphatase: 76 [IU]/L (ref 44–121)
BUN/Creatinine Ratio: 18 (ref 9–20)
BUN: 17 mg/dL (ref 6–24)
Bilirubin Total: 0.7 mg/dL (ref 0.0–1.2)
CO2: 21 mmol/L (ref 20–29)
Calcium: 9.6 mg/dL (ref 8.7–10.2)
Chloride: 105 mmol/L (ref 96–106)
Creatinine, Ser: 0.92 mg/dL (ref 0.76–1.27)
Globulin, Total: 2.5 g/dL (ref 1.5–4.5)
Glucose: 113 mg/dL — ABNORMAL HIGH (ref 70–99)
Potassium: 4.7 mmol/L (ref 3.5–5.2)
Sodium: 143 mmol/L (ref 134–144)
Total Protein: 7.1 g/dL (ref 6.0–8.5)
eGFR: 99 mL/min/{1.73_m2} (ref >59)

## 2023-09-11 LAB — LIPID PANEL
Chol/HDL Ratio: 4.3 {ratio} (ref 0.0–5.0)
Cholesterol, Total: 175 mg/dL (ref 100–199)
HDL: 41 mg/dL (ref >39)
LDL Chol Calc (NIH): 114 mg/dL — ABNORMAL HIGH (ref 0–99)
Triglycerides: 107 mg/dL (ref 0–149)
VLDL Cholesterol Cal: 20 mg/dL (ref 5–40)

## 2023-09-11 LAB — TSH: TSH: 1.06 u[IU]/mL (ref 0.450–4.500)

## 2023-09-11 LAB — CBC
Hematocrit: 46.3 % (ref 37.5–51.0)
Hemoglobin: 15.4 g/dL (ref 13.0–17.7)
MCH: 30.6 pg (ref 26.6–33.0)
MCHC: 33.3 g/dL (ref 31.5–35.7)
MCV: 92 fL (ref 79–97)
Platelets: 278 10*3/uL (ref 150–450)
RBC: 5.03 x10E6/uL (ref 4.14–5.80)
RDW: 12.6 % (ref 11.6–15.4)
WBC: 7.1 10*3/uL (ref 3.4–10.8)

## 2023-09-11 LAB — PSA, TOTAL AND FREE
PSA, Free Pct: 20.8 %
PSA, Free: 0.25 ng/mL
Prostate Specific Ag, Serum: 1.2 ng/mL (ref 0.0–4.0)

## 2023-09-11 LAB — HEMOGLOBIN A1C
Est. average glucose Bld gHb Est-mCnc: 114 mg/dL
Hgb A1c MFr Bld: 5.6 % (ref 4.8–5.6)

## 2023-09-13 ENCOUNTER — Other Ambulatory Visit (HOSPITAL_BASED_OUTPATIENT_CLINIC_OR_DEPARTMENT_OTHER): Payer: Self-pay

## 2023-09-25 ENCOUNTER — Other Ambulatory Visit (HOSPITAL_BASED_OUTPATIENT_CLINIC_OR_DEPARTMENT_OTHER): Payer: Self-pay

## 2023-09-26 ENCOUNTER — Other Ambulatory Visit (HOSPITAL_COMMUNITY): Payer: Self-pay

## 2023-10-11 ENCOUNTER — Other Ambulatory Visit (HOSPITAL_BASED_OUTPATIENT_CLINIC_OR_DEPARTMENT_OTHER): Payer: Self-pay

## 2023-11-04 ENCOUNTER — Other Ambulatory Visit (HOSPITAL_BASED_OUTPATIENT_CLINIC_OR_DEPARTMENT_OTHER): Payer: Self-pay

## 2023-12-07 ENCOUNTER — Other Ambulatory Visit: Payer: Self-pay | Admitting: Sports Medicine

## 2023-12-07 DIAGNOSIS — F419 Anxiety disorder, unspecified: Secondary | ICD-10-CM

## 2023-12-08 ENCOUNTER — Other Ambulatory Visit (HOSPITAL_BASED_OUTPATIENT_CLINIC_OR_DEPARTMENT_OTHER): Payer: Self-pay

## 2023-12-08 MED ORDER — CLONAZEPAM 0.5 MG PO TABS
0.5000 mg | ORAL_TABLET | Freq: Every day | ORAL | 3 refills | Status: DC | PRN
  Filled 2023-12-18: qty 30, 30d supply, fill #0
  Filled 2024-02-02 – 2024-02-28 (×2): qty 30, 30d supply, fill #1
  Filled 2024-04-10: qty 30, 30d supply, fill #2
  Filled 2024-05-17: qty 30, 30d supply, fill #3

## 2023-12-18 ENCOUNTER — Other Ambulatory Visit (HOSPITAL_BASED_OUTPATIENT_CLINIC_OR_DEPARTMENT_OTHER): Payer: Self-pay

## 2023-12-18 ENCOUNTER — Other Ambulatory Visit: Payer: Self-pay

## 2023-12-20 ENCOUNTER — Other Ambulatory Visit (HOSPITAL_BASED_OUTPATIENT_CLINIC_OR_DEPARTMENT_OTHER): Payer: Self-pay

## 2024-01-02 ENCOUNTER — Other Ambulatory Visit (HOSPITAL_COMMUNITY): Payer: Self-pay

## 2024-01-12 ENCOUNTER — Other Ambulatory Visit (HOSPITAL_BASED_OUTPATIENT_CLINIC_OR_DEPARTMENT_OTHER): Payer: Self-pay

## 2024-02-02 ENCOUNTER — Other Ambulatory Visit (HOSPITAL_BASED_OUTPATIENT_CLINIC_OR_DEPARTMENT_OTHER): Payer: Self-pay

## 2024-02-28 ENCOUNTER — Other Ambulatory Visit (HOSPITAL_BASED_OUTPATIENT_CLINIC_OR_DEPARTMENT_OTHER): Payer: Self-pay

## 2024-04-10 ENCOUNTER — Other Ambulatory Visit (HOSPITAL_BASED_OUTPATIENT_CLINIC_OR_DEPARTMENT_OTHER): Payer: Self-pay

## 2024-04-27 ENCOUNTER — Encounter: Payer: Self-pay | Admitting: Sports Medicine

## 2024-05-17 ENCOUNTER — Other Ambulatory Visit (HOSPITAL_BASED_OUTPATIENT_CLINIC_OR_DEPARTMENT_OTHER): Payer: Self-pay

## 2024-05-17 DIAGNOSIS — F4322 Adjustment disorder with anxiety: Secondary | ICD-10-CM | POA: Diagnosis not present

## 2024-06-02 DIAGNOSIS — F4322 Adjustment disorder with anxiety: Secondary | ICD-10-CM | POA: Diagnosis not present

## 2024-06-08 ENCOUNTER — Telehealth: Payer: Self-pay

## 2024-06-08 NOTE — Telephone Encounter (Signed)
 Copied from CRM [id number]. Topic: General - Other >> Jun 07, 2024  4:36 PM Nathanel BROCKS wrote: Reason for CRM: pt returned call and asked that whatever the message is to just [place] message him.

## 2024-06-08 NOTE — Telephone Encounter (Signed)
 Copied from CRM [id number]. Topic: Appointments - Transfer of Care >> Jun 08, 2024 10:27 AM Antwanette L wrote: Pt is requesting to transfer FROM: Dr.Thomas Thekkekandam. Pt is requesting to transfer TO: Zada Palin DNP. Reason for requested transfer: Dr. Curtis is no longer at [facility] It is the responsibility of the team the patient would like to transfer to (Joy Jessup DNP) to reach out to the patient if for any reason this transfer is not acceptable.

## 2024-06-09 DIAGNOSIS — F4322 Adjustment disorder with anxiety: Secondary | ICD-10-CM | POA: Diagnosis not present

## 2024-06-11 ENCOUNTER — Other Ambulatory Visit: Payer: Self-pay

## 2024-06-12 ENCOUNTER — Other Ambulatory Visit (HOSPITAL_BASED_OUTPATIENT_CLINIC_OR_DEPARTMENT_OTHER): Payer: Self-pay

## 2024-06-14 ENCOUNTER — Other Ambulatory Visit: Payer: Self-pay

## 2024-06-19 ENCOUNTER — Other Ambulatory Visit (HOSPITAL_BASED_OUTPATIENT_CLINIC_OR_DEPARTMENT_OTHER): Payer: Self-pay

## 2024-07-02 ENCOUNTER — Encounter: Admitting: Medical-Surgical

## 2024-07-02 DIAGNOSIS — F4310 Post-traumatic stress disorder, unspecified: Secondary | ICD-10-CM | POA: Diagnosis not present

## 2024-07-12 ENCOUNTER — Ambulatory Visit: Admitting: Urgent Care

## 2024-07-24 ENCOUNTER — Other Ambulatory Visit (HOSPITAL_BASED_OUTPATIENT_CLINIC_OR_DEPARTMENT_OTHER): Payer: Self-pay

## 2024-08-01 NOTE — Progress Notes (Unsigned)
        Established patient visit   History of Present Illness   Discussed the use of AI scribe software for clinical note transcription with the patient, who gave verbal consent to proceed.  History of Present Illness   Seth Waters is a 53 year old male with hypertension who presents for medication management and follow-up.  Elevated blood pressure - Hypertension previously controlled with Olmesartan  20 mg, which provided better control than other regimens. - Discontinued Olmesartan  after during a stressful period, resulting in elevated blood pressures. - Started on losartan by another provider via telehealth but prefers to resume Olmesartan  for improved blood pressure control. - No recent chest pain or shortness of breath.  Anxiety and sleep disturbance - Anxiety managed with clonazepam  as needed and hydroxyzine  for anxiety and sleep. - Trial of Quviviq  for sleep discontinued due to morning grogginess. - History of working in high-stress environments, including a cardiac cath lab, which exacerbated stress and anxiety. - Diagnosed with sleep apnea.  Tobacco use history - Notable for 30 pack-year smoking history. - Quit smoking several years ago. - Interested in repeating the annual lung CT for cancer screening.      Physical Exam   Physical Exam Vitals reviewed.  Constitutional:      General: He is not in acute distress.    Appearance: Normal appearance. He is obese. He is not ill-appearing.  HENT:     Head: Normocephalic and atraumatic.  Cardiovascular:     Rate and Rhythm: Normal rate and regular rhythm.     Pulses: Normal pulses.     Heart sounds: Normal heart sounds. No murmur heard.    No friction rub. No gallop.  Pulmonary:     Effort: Pulmonary effort is normal. No respiratory distress.     Breath sounds: Normal breath sounds.  Skin:    General: Skin is warm and dry.  Neurological:     Mental Status: He is alert and oriented to person, place, and time.   Psychiatric:        Mood and Affect: Mood normal.        Behavior: Behavior normal.        Thought Content: Thought content normal.        Judgment: Judgment normal.    Assessment & Plan     Essential hypertension Hypertension previously controlled with olmesartan . Was started on losartan by a telehealth provider recently. Prefers olmesartan . - Prescribed olmesartan  20 mg, start with half tablet for two weeks, then increase to full tablet if needed. - Scheduled nurse visit in two weeks for blood pressure check. - Recommend home BP monitoring.   Hyperlipidemia Currently taking Crestor  40mg  daily, well tolerated.  - Updating lipid panel today.  - Continue Crestor  as prescribed.   Anxiety with depression/OSA Anxiety managed with clonazepam  as needed. Effective for anxiety. Uses hydroxyzine  for anxiety and sleep. No interest in daily use. - Refilled clonazepam  prescription. - Continue hydroxyzine  as prescribed.   Smoker Smoking history with 30 pack-years. Last lung cancer screening in 2022. Wants to repeat this.  - Ordered low dose lung CT.   General Health Maintenance Due for pneumonia vaccine as guidelines start at age 55. No recent vaccine received. - Administered pneumonia vaccine.     Follow up   Return in about 2 weeks (around 08/16/2024) for nurse visit for BP check. __________________________________ Zada FREDRIK Palin, DNP, APRN, FNP-BC Primary Care [facility] Sports Medicine [facility] [place]

## 2024-08-02 ENCOUNTER — Encounter: Payer: Self-pay | Admitting: Medical-Surgical

## 2024-08-02 ENCOUNTER — Ambulatory Visit: Admitting: Medical-Surgical

## 2024-08-02 ENCOUNTER — Other Ambulatory Visit (HOSPITAL_BASED_OUTPATIENT_CLINIC_OR_DEPARTMENT_OTHER): Payer: Self-pay

## 2024-08-02 ENCOUNTER — Other Ambulatory Visit (HOSPITAL_COMMUNITY): Payer: Self-pay

## 2024-08-02 VITALS — BP 127/84 | HR 53 | Ht 69.0 in | Wt 213.2 lb

## 2024-08-02 DIAGNOSIS — F419 Anxiety disorder, unspecified: Secondary | ICD-10-CM

## 2024-08-02 DIAGNOSIS — E7800 Pure hypercholesterolemia, unspecified: Secondary | ICD-10-CM

## 2024-08-02 DIAGNOSIS — F32A Depression, unspecified: Secondary | ICD-10-CM

## 2024-08-02 DIAGNOSIS — Z23 Encounter for immunization: Secondary | ICD-10-CM

## 2024-08-02 DIAGNOSIS — F17200 Nicotine dependence, unspecified, uncomplicated: Secondary | ICD-10-CM

## 2024-08-02 DIAGNOSIS — I10 Essential (primary) hypertension: Secondary | ICD-10-CM

## 2024-08-02 DIAGNOSIS — E7849 Other hyperlipidemia: Secondary | ICD-10-CM

## 2024-08-02 DIAGNOSIS — G4733 Obstructive sleep apnea (adult) (pediatric): Secondary | ICD-10-CM

## 2024-08-02 DIAGNOSIS — L649 Androgenic alopecia, unspecified: Secondary | ICD-10-CM

## 2024-08-02 MED ORDER — CLONAZEPAM 0.5 MG PO TABS
0.5000 mg | ORAL_TABLET | Freq: Every day | ORAL | 3 refills | Status: AC | PRN
  Filled 2024-08-02 (×2): qty 30, 30d supply, fill #0
  Filled 2024-09-30: qty 30, 30d supply, fill #1

## 2024-08-02 MED ORDER — ROSUVASTATIN CALCIUM 40 MG PO TABS
40.0000 mg | ORAL_TABLET | Freq: Every day | ORAL | 3 refills | Status: AC
  Filled 2024-08-02: qty 90, 90d supply, fill #0
  Filled 2024-09-30: qty 30, 30d supply, fill #0

## 2024-08-02 MED ORDER — OLMESARTAN MEDOXOMIL 20 MG PO TABS
20.0000 mg | ORAL_TABLET | Freq: Every day | ORAL | 3 refills | Status: AC
  Filled 2024-08-02: qty 90, 90d supply, fill #0
  Filled 2024-08-02: qty 15, 15d supply, fill #0
  Filled 2024-08-02: qty 75, 75d supply, fill #0

## 2024-08-02 MED ORDER — HYDROXYZINE HCL 25 MG PO TABS
25.0000 mg | ORAL_TABLET | Freq: Every day | ORAL | 11 refills | Status: AC
  Filled 2024-08-02 – 2024-09-30 (×2): qty 60, 30d supply, fill #0

## 2024-08-03 ENCOUNTER — Ambulatory Visit: Payer: Self-pay | Admitting: Medical-Surgical

## 2024-08-03 DIAGNOSIS — I700 Atherosclerosis of aorta: Secondary | ICD-10-CM

## 2024-08-03 DIAGNOSIS — J439 Emphysema, unspecified: Secondary | ICD-10-CM

## 2024-08-03 LAB — LIPID PANEL
Chol/HDL Ratio: 4.1 ratio (ref 0.0–5.0)
Cholesterol, Total: 195 mg/dL (ref 100–199)
HDL: 48 mg/dL (ref >39)
LDL Chol Calc (NIH): 132 mg/dL — ABNORMAL HIGH (ref 0–99)
Triglycerides: 85 mg/dL (ref 0–149)
VLDL Cholesterol Cal: 15 mg/dL (ref 5–40)

## 2024-08-03 LAB — CBC WITH DIFFERENTIAL/PLATELET
Basophils Absolute: 0.1 x10E3/uL (ref 0.0–0.2)
Basos: 1 %
EOS (ABSOLUTE): 0.1 x10E3/uL (ref 0.0–0.4)
Eos: 2 %
Hematocrit: 46.9 % (ref 37.5–51.0)
Hemoglobin: 15.2 g/dL (ref 13.0–17.7)
Immature Grans (Abs): 0 x10E3/uL (ref 0.0–0.1)
Immature Granulocytes: 0 %
Lymphocytes Absolute: 2 x10E3/uL (ref 0.7–3.1)
Lymphs: 32 %
MCH: 30.5 pg (ref 26.6–33.0)
MCHC: 32.4 g/dL (ref 31.5–35.7)
MCV: 94 fL (ref 79–97)
Monocytes Absolute: 1 x10E3/uL — ABNORMAL HIGH (ref 0.1–0.9)
Monocytes: 16 %
Neutrophils Absolute: 3.1 x10E3/uL (ref 1.4–7.0)
Neutrophils: 49 %
Platelets: 243 x10E3/uL (ref 150–450)
RBC: 4.99 x10E6/uL (ref 4.14–5.80)
RDW: 12.8 % (ref 11.6–15.4)
WBC: 6.3 x10E3/uL (ref 3.4–10.8)

## 2024-08-16 ENCOUNTER — Ambulatory Visit

## 2024-08-16 DIAGNOSIS — Z122 Encounter for screening for malignant neoplasm of respiratory organs: Secondary | ICD-10-CM

## 2024-08-16 DIAGNOSIS — F17200 Nicotine dependence, unspecified, uncomplicated: Secondary | ICD-10-CM

## 2024-08-16 DIAGNOSIS — F17210 Nicotine dependence, cigarettes, uncomplicated: Secondary | ICD-10-CM | POA: Diagnosis not present

## 2024-08-25 DIAGNOSIS — I700 Atherosclerosis of aorta: Secondary | ICD-10-CM | POA: Insufficient documentation

## 2024-08-25 DIAGNOSIS — J439 Emphysema, unspecified: Secondary | ICD-10-CM | POA: Insufficient documentation

## 2024-09-30 ENCOUNTER — Other Ambulatory Visit (HOSPITAL_BASED_OUTPATIENT_CLINIC_OR_DEPARTMENT_OTHER): Payer: Self-pay

## 2024-09-30 ENCOUNTER — Other Ambulatory Visit (HOSPITAL_COMMUNITY): Payer: Self-pay

## 7363-11-25 DEATH — deceased
